# Patient Record
Sex: Female | Born: 1960 | Race: White | Hispanic: No | Smoking: Never smoker
Health system: Southern US, Community
[De-identification: ages and names within clinical notes are randomized; demographics above are authoritative.]

## PROBLEM LIST (undated history)

## (undated) DIAGNOSIS — Z1239 Encounter for other screening for malignant neoplasm of breast: Secondary | ICD-10-CM

## (undated) DIAGNOSIS — F909 Attention-deficit hyperactivity disorder, unspecified type: Secondary | ICD-10-CM

## (undated) DIAGNOSIS — F419 Anxiety disorder, unspecified: Secondary | ICD-10-CM

## (undated) DIAGNOSIS — Z8541 Personal history of malignant neoplasm of cervix uteri: Secondary | ICD-10-CM

## (undated) DIAGNOSIS — I7121 Aneurysm of the ascending aorta, without rupture: Secondary | ICD-10-CM

## (undated) DIAGNOSIS — I10 Essential (primary) hypertension: Secondary | ICD-10-CM

## (undated) DIAGNOSIS — G4730 Sleep apnea, unspecified: Secondary | ICD-10-CM

## (undated) DIAGNOSIS — R931 Abnormal findings on diagnostic imaging of heart and coronary circulation: Secondary | ICD-10-CM

## (undated) DIAGNOSIS — C801 Malignant (primary) neoplasm, unspecified: Secondary | ICD-10-CM

## (undated) HISTORY — PX: EYE SURGERY: SHX253

## (undated) HISTORY — DX: Abnormal findings on diagnostic imaging of heart and coronary circulation: R93.1

## (undated) HISTORY — DX: Personal history of malignant neoplasm of cervix uteri: Z85.41

## (undated) HISTORY — PX: REPLACEMENT TOTAL KNEE: SUR1224

## (undated) HISTORY — DX: Essential (primary) hypertension: I10

## (undated) HISTORY — PX: UPPER GI ENDOSCOPY: SHX6162

## (undated) HISTORY — PX: TUBAL LIGATION: SHX77

## (undated) HISTORY — PX: COLONOSCOPY: SHX174

## (undated) HISTORY — DX: Aneurysm of the ascending aorta, without rupture: I71.21

## (undated) HISTORY — DX: Encounter for other screening for malignant neoplasm of breast: Z12.39

## (undated) HISTORY — PX: HERNIA REPAIR: SHX51

---

## 1999-01-15 ENCOUNTER — Other Ambulatory Visit: Admission: RE | Admit: 1999-01-15 | Discharge: 1999-01-15 | Payer: Self-pay | Admitting: Obstetrics and Gynecology

## 1999-11-13 ENCOUNTER — Encounter: Admission: RE | Admit: 1999-11-13 | Discharge: 1999-11-13 | Payer: Self-pay | Admitting: Internal Medicine

## 1999-11-13 ENCOUNTER — Encounter: Payer: Self-pay | Admitting: Internal Medicine

## 1999-11-22 ENCOUNTER — Encounter: Admission: RE | Admit: 1999-11-22 | Discharge: 1999-11-22 | Payer: Self-pay | Admitting: Internal Medicine

## 1999-11-22 ENCOUNTER — Encounter: Payer: Self-pay | Admitting: Internal Medicine

## 2000-11-25 ENCOUNTER — Encounter: Payer: Self-pay | Admitting: Obstetrics and Gynecology

## 2000-11-25 ENCOUNTER — Encounter: Admission: RE | Admit: 2000-11-25 | Discharge: 2000-11-25 | Payer: Self-pay | Admitting: Obstetrics and Gynecology

## 2000-12-24 ENCOUNTER — Other Ambulatory Visit: Admission: RE | Admit: 2000-12-24 | Discharge: 2000-12-24 | Payer: Self-pay | Admitting: Obstetrics and Gynecology

## 2003-12-21 ENCOUNTER — Ambulatory Visit (HOSPITAL_BASED_OUTPATIENT_CLINIC_OR_DEPARTMENT_OTHER): Admission: RE | Admit: 2003-12-21 | Discharge: 2003-12-21 | Payer: Self-pay | Admitting: Orthopaedic Surgery

## 2003-12-21 ENCOUNTER — Ambulatory Visit (HOSPITAL_COMMUNITY): Admission: RE | Admit: 2003-12-21 | Discharge: 2003-12-21 | Payer: Self-pay | Admitting: Orthopaedic Surgery

## 2007-12-26 ENCOUNTER — Emergency Department (HOSPITAL_COMMUNITY): Admission: EM | Admit: 2007-12-26 | Discharge: 2007-12-27 | Payer: Self-pay | Admitting: Emergency Medicine

## 2010-06-08 ENCOUNTER — Ambulatory Visit: Payer: Self-pay | Admitting: Sports Medicine

## 2010-06-08 DIAGNOSIS — M25559 Pain in unspecified hip: Secondary | ICD-10-CM | POA: Insufficient documentation

## 2010-06-08 DIAGNOSIS — IMO0002 Reserved for concepts with insufficient information to code with codable children: Secondary | ICD-10-CM

## 2010-06-08 DIAGNOSIS — M25569 Pain in unspecified knee: Secondary | ICD-10-CM

## 2010-06-08 DIAGNOSIS — G8929 Other chronic pain: Secondary | ICD-10-CM | POA: Insufficient documentation

## 2010-06-26 ENCOUNTER — Ambulatory Visit: Payer: Self-pay | Admitting: Obstetrics and Gynecology

## 2010-07-16 ENCOUNTER — Ambulatory Visit: Payer: Self-pay | Admitting: Sports Medicine

## 2010-07-18 ENCOUNTER — Ambulatory Visit: Payer: Self-pay | Admitting: Obstetrics and Gynecology

## 2010-08-10 ENCOUNTER — Encounter: Admission: RE | Admit: 2010-08-10 | Discharge: 2010-08-10 | Payer: Self-pay | Admitting: Obstetrics and Gynecology

## 2010-08-14 ENCOUNTER — Ambulatory Visit: Payer: Self-pay | Admitting: Oncology

## 2010-09-10 ENCOUNTER — Ambulatory Visit (HOSPITAL_COMMUNITY): Admission: RE | Admit: 2010-09-10 | Discharge: 2010-09-10 | Payer: Self-pay | Admitting: General Surgery

## 2010-09-27 ENCOUNTER — Ambulatory Visit (HOSPITAL_BASED_OUTPATIENT_CLINIC_OR_DEPARTMENT_OTHER): Payer: BC Managed Care – PPO | Admitting: Oncology

## 2010-10-01 LAB — FOLLICLE STIMULATING HORMONE: FSH: 49.8 m[IU]/mL

## 2010-10-17 ENCOUNTER — Ambulatory Visit: Payer: Self-pay | Admitting: Obstetrics and Gynecology

## 2010-11-20 NOTE — Letter (Signed)
Summary: Generic Letter  [facility]  [address]   [place], [place] [address]   Phone: [phone]  Fax: [phone]    06/08/2010 Albertha Ghee, MD [facility] [place], [place] Fax [place] [address]   Dear [name]:  PLease see attached note on the patient below:  Ashlee Robbins [address] [place], [place]  [address]  She did find her way over here and wanted a piriformis injection so I gave that a try.  Of interest she does have a line of calcification in the piriformis consistent with a healing tear.         Sincerely,  Vincent Gros MD

## 2010-11-20 NOTE — Assessment & Plan Note (Signed)
Summary: NP,U/S OF PIRIFORMIS PER BASSETT,[facility]   Vital Signs:  Patient profile:   50 year old female Height:      67 inches Weight:      150 pounds BMI:     23.58 BP sitting:   169 / 116  Vitals Entered By: Lillia Pauls CMA (June 08, 2010 10:34 AM)  History of Present Illness: May 13 reaching for high backhand felt tear in RT center buttocks cont playing later icing but no better  given lyrica and rest by DR bassett and this did help however, too sleepy on lyrica so she stopped not on any PT She wanted an injection rather than meds and so Dr Cleophas Dunker suggested she could come here  no real probs w low back or SIJ  no prior hx of hip injury  has had arthrocopy on RT knee sometime in past RT knee does click and sometimes has given way on tennis court note when she did spin classes the knee did much better no swelling  some early bunion issues without much pain  Allergies (verified): No Known Drug Allergies  Past History:  Past Medical History: HBP normally takes prinizide but has been off  Family History: Fam Hx of HBP mother and aunt with breast ca grandfather had throat ca  Physical Exam  General:  Well-developed,well-nourished,in no acute distress; alert,appropriate and cooperative throughout examination Msk:  norm rotational motion of both hips weak on  abduction and on hip rotation on left RT hip is ver strong on these flexion and ext norm  leg lenth equal  piriformis stretch recreates pain TTP deep directly over area of piriformis  RT knee + McMurray with lots of clikcing on laeral joint line also noted some prob joint line spurring more laterally no effusion Additional Exam:  MSK [place] On virtual hip setting we can identify linear streak of calcification this appears to be at the fascial plane of inferior piriformis this is noted on long and transverse views no inc doppler activity  no active tear noted   Of note the RT knee lateral meniscus is  calcified and torn on scan and there is also DJD spurring   Impression & Recommendations:  Problem # 1:  HIP PAIN, LEFT (ICD-719.45)  this seems likd classic piriformis and on [place] there is an area of calcification consistent w a healed tear   cleanse with alcohol Topical analgesic spray : Ethyl choride Joint Approached in typical fashion with: [place] localization of calcifcation which corresponds to PMT - this was directlly infiltrated and fanned Completed without difficulty Meds:1 cc kenalog 40 + 6 cc lidocaine Needle:25 G and 1.5 in Aftercare instructions and Red flags advised  given stretches and exercises would like to reck in 6 wks  Orders: [place] LIMITED ([address]) Joint Aspirate / Injection, Large (20610) Kenalog 10 mg inj (J3301)  Problem # 2:  KNEE PAIN, RIGHT, CHRONIC (ICD-719.46) I think she needsto restart bike exercises folow for swelling or mechanical sxs  Problem # 3:  MENISCUS TEAR, RIGHT (ICD-836.2) pretty obvious tear and DJD on RT  if this gives way or cause much pain - return and we will use a don joy

## 2010-11-20 NOTE — Assessment & Plan Note (Signed)
Summary: F/U,MC   Vital Signs:  Patient profile:   50 year old female Pulse rate:   76 / minute BP sitting:   148 / 95  (left arm)  Vitals Entered By: Lillia Pauls CMA (July 16, 2010 9:14 AM) CC: f/u pirformis and rt knee pain   CC:  f/u pirformis and rt knee pain.  History of Present Illness: 1. F/U Piriformis:  - Is about 75% better.  Thinks that the injection helped the most and would be interested in getting another one today if possible. - She has been doing the stretches as directed - She has been continuing to play tennis  2. Right knee pain: - Diagnosed with DJD and right meniscal tear by ultrasound - She has still been playing tennis but has noticed that it is more painful now - Pain is worse when she does a deep squat - It has not been catching or locking - No swelling  Allergies: No Known Drug Allergies  Physical Exam  General:  Well-developed,well-nourished,in no acute distress; alert,appropriate and cooperative throughout examination Msk:  norm rotational motion of both hips equal strength bilaterally  leg lenth equal  piriformis stretch recreates pain TTP deep directly over area of piriformis  RT knee + McMurray with lots of clikcing on lateral joint line Pain reproduced with deep squat Negative standing meniscal test   Impression & Recommendations:  Problem # 1:  HIP PAIN, RIGHT (ICD-719.45) Assessment Improved  Doing better.    Her updated medication list for this problem includes:    Tramadol Hcl 50 Mg Tabs (Tramadol hcl) .Marland Kitchen... 1 tab by mouth twice a day  would not inject again for at least 6 wks - she did get excellent relief  Problem # 2:  KNEE PAIN, RIGHT, CHRONIC (ICD-719.46) Assessment: Deteriorated  Worse since last visit.  Her updated medication list for this problem includes:    Tramadol Hcl 50 Mg Tabs (Tramadol hcl) .Marland Kitchen... 1 tab by mouth twice a day  keep using knee strap as this looks helpful  With DJD may need some  chronic med to control and will try tramadol  Problem # 3:  MENISCUS TEAR, RIGHT (ICD-836.2) this seems degenerative will follow but use some knee support avoid deep knee bend  Complete Medication List: 1)  Tramadol Hcl 50 Mg Tabs (Tramadol hcl) .Marland Kitchen.. 1 tab by mouth twice a day Prescriptions: TRAMADOL HCL 50 MG TABS (TRAMADOL HCL) 1 tab by mouth twice a day  #100 x 1   Entered by:   Angelena Sole MD   Authorized by:   Enid Baas MD   Signed by:   Angelena Sole MD on 07/16/2010   Method used:   Electronically to        [facility] [id number] (retail)       [address] [place], [place]  [address]       Ph: [phone]       Fax: [phone]   RxID:   [id number]

## 2010-11-26 ENCOUNTER — Encounter: Payer: BC Managed Care – PPO | Admitting: Internal Medicine

## 2010-11-26 DIAGNOSIS — Z803 Family history of malignant neoplasm of breast: Secondary | ICD-10-CM

## 2010-11-26 LAB — CBC WITH DIFFERENTIAL/PLATELET
BASO%: 0.5 % (ref 0.0–2.0)
Basophils Absolute: 0 10*3/uL (ref 0.0–0.1)
EOS%: 0.3 % (ref 0.0–7.0)
Eosinophils Absolute: 0 10*3/uL (ref 0.0–0.5)
HCT: 41 % (ref 34.8–46.6)
HGB: 14.3 g/dL (ref 11.6–15.9)
LYMPH%: 26 % (ref 14.0–49.7)
MCH: 34.5 pg — ABNORMAL HIGH (ref 25.1–34.0)
MCHC: 34.8 g/dL (ref 31.5–36.0)
MCV: 99.3 fL (ref 79.5–101.0)
MONO#: 0.4 10*3/uL (ref 0.1–0.9)
MONO%: 6.8 % (ref 0.0–14.0)
NEUT#: 3.9 10*3/uL (ref 1.5–6.5)
NEUT%: 66.4 % (ref 38.4–76.8)
Platelets: 267 10*3/uL (ref 145–400)
RBC: 4.13 10*6/uL (ref 3.70–5.45)
RDW: 13.6 % (ref 11.2–14.5)
WBC: 5.9 10*3/uL (ref 3.9–10.3)
lymph#: 1.5 10*3/uL (ref 0.9–3.3)

## 2010-11-26 LAB — GAMMA GT: GGT: 18 U/L (ref 7–51)

## 2010-11-27 ENCOUNTER — Other Ambulatory Visit: Payer: Self-pay | Admitting: Oncology

## 2010-11-27 DIAGNOSIS — Z1239 Encounter for other screening for malignant neoplasm of breast: Secondary | ICD-10-CM

## 2010-11-27 DIAGNOSIS — Z803 Family history of malignant neoplasm of breast: Secondary | ICD-10-CM

## 2010-11-27 LAB — COMPREHENSIVE METABOLIC PANEL
ALT: 18 U/L (ref 0–35)
AST: 26 U/L (ref 0–37)
Albumin: 4.7 g/dL (ref 3.5–5.2)
Alkaline Phosphatase: 44 U/L (ref 39–117)
BUN: 16 mg/dL (ref 6–23)
CO2: 26 mEq/L (ref 19–32)
Calcium: 10.1 mg/dL (ref 8.4–10.5)
Chloride: 101 mEq/L (ref 96–112)
Creatinine, Ser: 0.91 mg/dL (ref 0.40–1.20)
Glucose, Bld: 85 mg/dL (ref 70–99)
Potassium: 4.2 mEq/L (ref 3.5–5.3)
Sodium: 140 mEq/L (ref 135–145)
Total Bilirubin: 0.6 mg/dL (ref 0.3–1.2)
Total Protein: 7.2 g/dL (ref 6.0–8.3)

## 2010-11-27 LAB — VITAMIN D 25 HYDROXY (VIT D DEFICIENCY, FRACTURES): Vit D, 25-Hydroxy: 72 ng/mL (ref 30–89)

## 2010-12-12 ENCOUNTER — Other Ambulatory Visit: Payer: BC Managed Care – PPO

## 2010-12-12 ENCOUNTER — Other Ambulatory Visit: Payer: Self-pay | Admitting: Oncology

## 2010-12-12 DIAGNOSIS — Z1239 Encounter for other screening for malignant neoplasm of breast: Secondary | ICD-10-CM

## 2011-01-01 LAB — COMPREHENSIVE METABOLIC PANEL
ALT: 19 U/L (ref 0–35)
AST: 27 U/L (ref 0–37)
Alkaline Phosphatase: 41 U/L (ref 39–117)
CO2: 29 mEq/L (ref 19–32)
Calcium: 9.4 mg/dL (ref 8.4–10.5)
Chloride: 105 mEq/L (ref 96–112)
GFR calc Af Amer: >60 mL/min (ref >60)
GFR calc non Af Amer: >60 mL/min (ref >60)
Potassium: 4.3 mEq/L (ref 3.5–5.1)
Sodium: 142 mEq/L (ref 135–145)
Total Bilirubin: 1 mg/dL (ref 0.3–1.2)

## 2011-01-01 LAB — CBC
HCT: 41.7 % (ref 36.0–46.0)
Hemoglobin: 14.5 g/dL (ref 12.0–15.0)
RBC: 4.16 MIL/uL (ref 3.87–5.11)
WBC: 4.2 10*3/uL (ref 4.0–10.5)

## 2011-01-01 LAB — DIFFERENTIAL
Basophils Relative: 1 % (ref 0–1)
Eosinophils Absolute: 0.1 10*3/uL (ref 0.0–0.7)
Eosinophils Relative: 1 % (ref 0–5)
Lymphs Abs: 1.4 10*3/uL (ref 0.7–4.0)

## 2011-01-01 LAB — SURGICAL PCR SCREEN
MRSA, PCR: NEGATIVE
Staphylococcus aureus: NEGATIVE

## 2011-03-08 NOTE — Op Note (Signed)
NAME:  RHANDI, DESPAIN                           ACCOUNT NO.:  [id number]   MEDICAL RECORD NO.:  [id number]                   PATIENT TYPE:  AMB   LOCATION:  [facility]                                  FACILITY:  [facility]   PHYSICIAN:  Claude Manges. Cleophas Dunker, M.D.            DATE OF BIRTH:  1961/06/16   DATE OF PROCEDURE:  12/21/2003  DATE OF DISCHARGE:                                 OPERATIVE REPORT   PREOPERATIVE DIAGNOSES:  1. Bucket handle tear of lateral meniscus with displacement.  2. Degenerative changes, lateral compartment and patellofemoral joint, right     knee.   POSTOPERATIVE DIAGNOSES:  1. Bucket handle tear of lateral meniscus with displacement.  2. Degenerative changes, lateral compartment and patellofemoral joint, right     knee.   PROCEDURES:  1. Diagnostic arthroscopy, right knee.  2. Partial lateral meniscectomy.  3. Shaving of patellofemoral articular cartilage.   SURGEON:  Claude Manges. Cleophas Dunker, M.D.   ANESTHESIA:  General orotracheal.   COMPLICATIONS:  None.   HISTORY:  This 50 year old female apparently had an injury to her right knee  about 20 years ago requiring a repair of the lateral meniscus.  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  She has actually done very well up  until the last two or three months when she has been experiencing recurrent  pain in her right knee.  She is very active in tennis, but is not aware that  she has had a specific injury or trauma.  She does have a slight anterior  drawer sign compared to the opposite knee but really has not had any  sensation of her knee giving way.  Her pain is localized on the lateral  joint. She has had an MRI scan consistent with a bucket handle tear of the  lateral meniscus with displacement.  She also was noted to have a tear of  the anterior cruciate ligament.  At this point, we will perform arthroscopy  of the knee and consider lateral meniscectomy without addressing the ACL  tear.   PROCEDURE:   With the patient comfortable on the operating room table and  under orolaryngeal general anesthetic, the right lower extremity was placed  in a thigh holder.  The leg was then prepped with Duraprep from the thigh  holder to the ankle.  Sterile draping was performed.   Then 0.25% Marcaine with epinephrine was injected into the arthroscopic  portals on either side of the patella tendon at the level of the joint.  Small puncture wounds were made.  The arthroscope was then placed into the  joint without evidence of effusion. Diagnostic arthroscopy revealed no  evidence of loose material.  In the superior pouch, there was very minimal  synovitis.  There was some very mild chondromalacia of the patella and to a  greater extent, there were grade 2 and even early grade 3 changes of  chondromalacia of the trochlea.  This area was shaved.  Both gutters were  clear of any loose material or significant synovitis.   The medial compartment was clear of meniscal pathology or chondromalacia.   The intercondylar notch was then evaluated.  There was an obvious displaced  bucket handle tear of the lateral meniscus that obliterated the lateral  joint space and much of the intercondylar notch.  The interarticular shaver  was then introduced so that I could amputate the torn lateral meniscus in  its posterior extent.  Further shaving was then performed to remove any  further loose and torn lateral meniscal tissue.  The lateral compartment was  then more easily evaluated.  There was some chondromalacia involving grade 2  changes of the femoral condyle and tibial plateau.  The ACL was chronically  torn.  There was about a 2-mm anterior drawer sign.  There were still some  fibers of the anterior cruciate ligament remaining.  I carefully probed the  remaining lateral meniscus.  It was torn for the most part from its mid-  portion posteriorly, and a very few areas of frayed meniscus just anterior  to the midline.  Further shaving was then performed, and it was stabilized  with the ArthroCare wand.  The joint was carefully inspected for evidence of  loose material.  The irrigation fluid was removed.  The two stab wounds were  again infiltrated with 0.25% Marcaine with epinephrine.  A sterile bulky  dressing was applied followed by an Ace bandage.   PLAN:  Percocet for pain, office one week.                                               Claude Manges. Cleophas Dunker, M.D.    PWW/MEDQ  D:  12/21/2003  T:  12/21/2003  Job:  [id number]

## 2011-03-29 ENCOUNTER — Ambulatory Visit (INDEPENDENT_AMBULATORY_CARE_PROVIDER_SITE_OTHER): Payer: BC Managed Care – PPO | Admitting: Family Medicine

## 2011-03-29 ENCOUNTER — Encounter: Payer: Self-pay | Admitting: Family Medicine

## 2011-03-29 VITALS — BP 144/98

## 2011-03-29 DIAGNOSIS — M25559 Pain in unspecified hip: Secondary | ICD-10-CM

## 2011-03-29 NOTE — Assessment & Plan Note (Signed)
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 She has no signs of acute injury. - Right piriformis injection was completed in the office today Consent obtained and verified. Sterile betadine prep. Furthur cleansed with alcohol. Topical analgesic spray: Ethyl chloride. Joint: Right piriformis Approached in typical fashion with: posterior approach.  Area of maximal tenderness was identified with MSK ultrasound & area was marked.  Injection completed without ultrasound guidance Completed without difficulty Meds: 1 cc Kenalog 40 mg/cc +6 cc 1% lidocaine Needle: 25-gauge spinal needle Aftercare instructions and Red flags advised. Tolerated procedure well without any complications - Reviewed hip exercises to improve strength. Showed several stretches to continue to help with her flexibility. - Recommend she rest from tennis for the next one to 2 days, then may return. - Followup 6 weeks for reevaluation, call if any questions or concerns

## 2011-03-29 NOTE — Progress Notes (Signed)
  Subjective:    Patient ID: Ashlee Robbins, female    DOB: [birth year], 50 y.o.   MRN: [id number]  HPI 50 year old female to the office for evaluation of right posterior hip pain. Seen in our office 05/2000 and diagnosed with piriformis syndrome with visible calcification in her puriform is consistent with a healed tear. She underwent piriformis is injection at that time with good relief of symptoms. On followup in September 2001 and was still having some pain was approximately 75% improved. Since that time she has had ongoing pain in her right buttock that is a dull ache at baseline but increases with activity. Does have some radiation of the pain down her posterior thigh stopping at her knee. It is worse after playing tennis, typically plays 2-3 times a week, also does spinning once a week. Ice does seem to help. She is working with a Systems analyst, and no longer doing hip exercises we have shown her, but she is doing several hip and piriformis stretches.   Review of Systems Per history of present illness otherwise negative    Objective:   Physical Exam GENERAL: Alert and oriented x3, no acute stress, pleasant SKIN: No rashes or lesions  MSK: - Hips: Right hip with good range of motion, does have slight restriction with internal and external rotation. She is weak with adduction and mildly weak with abduction. Piriformis stretch does recur produce pain. She is tender to palpation directly over piriformis and issue tuberosity, mild tenderness over the greater trochanter. Left hip with good range of motion with mild tightness with internal rotation. Also has mild weakness with hip abductors and adductors. No pain with piriformis stretch. - Back: Good range of motion without pain. No scoliosis. Excellent mobility of her SI joints, fever slightly tight on the right compared to the left. Negative straight leg raise bilaterally. - Gait: Walks with left foot slightly internally rotated. She has no leg  length difference. With running as forward leaning of her upper body, again noted to have internally rotated left foot. NEURO: Sensation intact to light touch, DTR +2/4 Achilles and patella bilaterally Vascular: Pulses 2+ and symmetric  MSK ultrasound: Right piriformis was visualized again noted to have linear streaky opacification consistent with healed previous tear. This was seen on both long and transverse views. She has no hypoechoic change and no increased Doppler flow. Images saved       Assessment & Plan:

## 2011-07-15 LAB — PROTIME-INR
INR: 0.9
Prothrombin Time: 12.5

## 2011-07-15 LAB — CBC
Platelets: 266
RBC: 3.63 — ABNORMAL LOW
WBC: 5.7

## 2011-08-12 ENCOUNTER — Ambulatory Visit
Admission: RE | Admit: 2011-08-12 | Discharge: 2011-08-12 | Disposition: A | Discharge status: Home / Self Care | Payer: BC Managed Care – PPO | Source: Ambulatory Visit | Attending: Oncology | Admitting: Oncology

## 2011-08-12 DIAGNOSIS — Z1239 Encounter for other screening for malignant neoplasm of breast: Secondary | ICD-10-CM

## 2011-08-14 ENCOUNTER — Other Ambulatory Visit: Payer: Self-pay | Admitting: Orthopedic Surgery

## 2011-08-14 DIAGNOSIS — S63639A Sprain of interphalangeal joint of unspecified finger, initial encounter: Secondary | ICD-10-CM

## 2011-08-19 ENCOUNTER — Other Ambulatory Visit: Payer: Self-pay | Admitting: *Deleted

## 2011-08-19 ENCOUNTER — Encounter: Payer: Self-pay | Admitting: *Deleted

## 2011-08-19 DIAGNOSIS — IMO0002 Reserved for concepts with insufficient information to code with codable children: Secondary | ICD-10-CM

## 2011-08-19 NOTE — Progress Notes (Signed)
  Subjective:    Patient ID: Ashlee Robbins, female    DOB: 08/27/1961, 50 y.o.   MRN: [id number]  HPI    Review of Systems     Objective:   Physical Exam        Assessment & Plan:  Pt is still having problems with locking and catching of the right knee, which we diagnosed a meniscal tear earlier this year. Pt has 2 scheduled MRIs for other issues on November 19th and is requesting on for her knee that day as well since she has not met her deductible. Scheduled pt for R knee MRI on November 19th at 3:15 at [facility] imaging. Pt informed.

## 2011-08-19 NOTE — Patient Instructions (Signed)
Prior auth for MRI of R knee is [id number]

## 2011-09-09 ENCOUNTER — Ambulatory Visit
Admission: RE | Admit: 2011-09-09 | Discharge: 2011-09-09 | Disposition: A | Discharge status: Home / Self Care | Payer: BC Managed Care – PPO | Source: Ambulatory Visit | Attending: Oncology | Admitting: Oncology

## 2011-09-09 ENCOUNTER — Ambulatory Visit
Admission: RE | Admit: 2011-09-09 | Discharge: 2011-09-09 | Disposition: A | Discharge status: Home / Self Care | Payer: BC Managed Care – PPO | Source: Ambulatory Visit | Attending: Sports Medicine | Admitting: Sports Medicine

## 2011-09-09 ENCOUNTER — Ambulatory Visit
Admission: RE | Admit: 2011-09-09 | Discharge: 2011-09-09 | Disposition: A | Discharge status: Home / Self Care | Payer: BC Managed Care – PPO | Source: Ambulatory Visit | Attending: Orthopedic Surgery | Admitting: Orthopedic Surgery

## 2011-09-09 DIAGNOSIS — Z1239 Encounter for other screening for malignant neoplasm of breast: Secondary | ICD-10-CM

## 2011-09-09 DIAGNOSIS — Z803 Family history of malignant neoplasm of breast: Secondary | ICD-10-CM

## 2011-09-09 DIAGNOSIS — IMO0002 Reserved for concepts with insufficient information to code with codable children: Secondary | ICD-10-CM

## 2011-09-09 DIAGNOSIS — S63639A Sprain of interphalangeal joint of unspecified finger, initial encounter: Secondary | ICD-10-CM

## 2011-09-09 MED ORDER — GADOBENATE DIMEGLUMINE 529 MG/ML IV SOLN
14.0000 mL | Freq: Once | INTRAVENOUS | Status: AC | PRN
  Administered 2011-09-09: 14 mL via INTRAVENOUS

## 2011-09-20 ENCOUNTER — Telehealth: Payer: Self-pay | Admitting: *Deleted

## 2011-09-20 NOTE — Telephone Encounter (Signed)
Pt.notified

## 2011-09-20 NOTE — Telephone Encounter (Signed)
Message copied by Cooper Render on Fri Sep 20, 2011 12:59 PM ------      Message from: Victorino December      Created: Wed Sep 11, 2011 12:00 PM       Call pt MRI looks good

## 2011-10-29 ENCOUNTER — Ambulatory Visit: Payer: BC Managed Care – PPO | Admitting: Sports Medicine

## 2011-11-09 ENCOUNTER — Telehealth: Payer: Self-pay | Admitting: Oncology

## 2011-11-09 NOTE — Telephone Encounter (Signed)
Converted 2/7 appt 2/18. S/w pt today she is aware.

## 2011-12-09 ENCOUNTER — Ambulatory Visit (HOSPITAL_BASED_OUTPATIENT_CLINIC_OR_DEPARTMENT_OTHER): Payer: BC Managed Care – PPO | Admitting: Oncology

## 2011-12-09 ENCOUNTER — Telehealth: Payer: Self-pay | Admitting: Oncology

## 2011-12-09 VITALS — BP 135/90 | HR 89 | Temp 97.7°F | Ht 67.0 in | Wt 153.2 lb

## 2011-12-09 DIAGNOSIS — Z1239 Encounter for other screening for malignant neoplasm of breast: Secondary | ICD-10-CM

## 2011-12-09 DIAGNOSIS — Z803 Family history of malignant neoplasm of breast: Secondary | ICD-10-CM

## 2011-12-09 NOTE — Telephone Encounter (Signed)
gve the pt her feb 2014 appt calendar °

## 2011-12-09 NOTE — Progress Notes (Signed)
OFFICE PROGRESS NOTE  CC  Ashlee Settler, MD, MD No address on file Dr. Avel Peace Dr. Edyth Gunnels  DIAGNOSIS: 51 year old female seen in the high-risk clinic for ongoing discussions about breast cancer risk reduction.  PRIOR THERAPY:  #1 she was originally seen by me in 2011. We did risk assessment and her cancer risk was estimated at 20-25%.  #2 patient has had BRCA1 and 2 testing performed which was negative. She also had MEN2 gene RET done as well that was negative.  #3 patient was recommended Evista on a daily basis but she could not tolerate it and she discontinued it.  #3 she does get yearly MRIs as well as mammograms as part of her ongoing surveillance. She will be due for a mammogram  Sometime this year. By recommendation would also be for her to get yearly MRIs performed as well.  CURRENT THERAPY:Observation and radiographic surveillance with mammograms and MRIs on a yearly basis.  INTERVAL HISTORY: Ashlee Robbins 51 y.o. female returns for Followup visit today. Overall she is doing well. She denies any fevers chills night sweats headaches shortness of breath chest pains palpitations she has no myalgias or arthralgias. She does not have any evidence of any breast masses. She continues to be followed by her primary care physician as well as her gynecologist. She is very active she continues to play tennis avidly. Remainder of the 10 point review of systems is negative.  MEDICAL HISTORY:No past medical history on file.  ALLERGIES:   has no known allergies.  MEDICATIONS:  No current outpatient prescriptions on file.    SURGICAL HISTORY: No past surgical history on file.  REVIEW OF SYSTEMS:  Pertinent items are noted in HPI.   PHYSICAL EXAMINATION: General appearance: alert, cooperative and appears stated age Neck: no adenopathy, no carotid bruit, no JVD, supple, symmetrical, trachea midline and thyroid not enlarged, symmetric, no tenderness/mass/nodules Lymph  nodes: Cervical, supraclavicular, and axillary nodes normal. Resp: clear to auscultation bilaterally and normal percussion bilaterally Back: symmetric, no curvature. ROM normal. No CVA tenderness. Cardio: regular rate and rhythm, S1, S2 normal, no murmur, click, rub or gallop GI: soft, non-tender; bowel sounds normal; no masses,  no organomegaly Extremities: extremities normal, atraumatic, no cyanosis or edema Neurologic: Alert and oriented X 3, normal strength and tone. Normal symmetric reflexes. Normal coordination and gait Bilateral breast examinations: There are no palpable masses nipple discharge or skin changes bilaterally. ECOG PERFORMANCE STATUS: 0 - Asymptomatic  Blood pressure 135/90, pulse 89, temperature 97.7 F (36.5 C), temperature source Oral, height 5\' 7"  (1.702 m), weight 153 lb 3.2 oz (69.491 kg).  LABORATORY DATA: Lab Results  Component Value Date   WBC 5.9 11/26/2010   HGB 14.3 11/26/2010   HCT 41.0 11/26/2010   MCV 99.3 11/26/2010   PLT 267 11/26/2010      Chemistry      Component Value Date/Time   NA 140 11/26/2010 1339   K 4.2 11/26/2010 1339   CL 101 11/26/2010 1339   CO2 26 11/26/2010 1339   BUN 16 11/26/2010 1339   CREATININE 0.91 11/26/2010 1339      Component Value Date/Time   CALCIUM 10.1 11/26/2010 1339   ALKPHOS 44 11/26/2010 1339   AST 26 11/26/2010 1339   ALT 18 11/26/2010 1339   BILITOT 0.6 11/26/2010 1339       RADIOGRAPHIC STUDIES:  No results found.  ASSESSMENT: 51 year old female at high-risk for developing breast cancer due to her family history and 5 this evaluation breast  cancer risk estimated at 20-25%. Patient has had BRCA1 and 2 testing performed which was negative for both of the mutations. Overall she is doing well. Vision has been tried on Evista but she could not tolerate it and now she is on observation only.   PLAN:   #1 continue yearly MRI and mammogram due to her high risk of developing breast cancer.  #2 she will be seen back in one years  time.   All questions were answered. The patient knows to call the clinic with any problems, questions or concerns. We can certainly see the patient much sooner if necessary.  I spent 30 minutes counseling the patient face to face. The total time spent in the appointment was 30 minutes.    Drue Second, MD Medical/Oncology [facility] [phone] (beeper) [phone] (Office)  12/09/2011, 3:20 PM

## 2012-07-28 ENCOUNTER — Other Ambulatory Visit: Payer: Self-pay | Admitting: Oncology

## 2012-07-28 DIAGNOSIS — Z1231 Encounter for screening mammogram for malignant neoplasm of breast: Secondary | ICD-10-CM

## 2012-08-19 ENCOUNTER — Ambulatory Visit: Payer: BC Managed Care – PPO

## 2012-09-30 ENCOUNTER — Ambulatory Visit: Payer: BC Managed Care – PPO

## 2012-10-01 ENCOUNTER — Ambulatory Visit
Admission: RE | Admit: 2012-10-01 | Discharge: 2012-10-01 | Disposition: A | Discharge status: Home / Self Care | Payer: BC Managed Care – PPO | Source: Ambulatory Visit | Attending: Oncology | Admitting: Oncology

## 2012-10-01 DIAGNOSIS — Z1231 Encounter for screening mammogram for malignant neoplasm of breast: Secondary | ICD-10-CM

## 2012-12-08 ENCOUNTER — Other Ambulatory Visit: Payer: Self-pay | Admitting: Medical Oncology

## 2012-12-08 DIAGNOSIS — M25569 Pain in unspecified knee: Secondary | ICD-10-CM

## 2012-12-08 DIAGNOSIS — IMO0002 Reserved for concepts with insufficient information to code with codable children: Secondary | ICD-10-CM

## 2012-12-08 DIAGNOSIS — M25559 Pain in unspecified hip: Secondary | ICD-10-CM

## 2012-12-09 ENCOUNTER — Encounter: Payer: Self-pay | Admitting: Oncology

## 2012-12-09 ENCOUNTER — Telehealth: Payer: Self-pay | Admitting: Oncology

## 2012-12-09 ENCOUNTER — Other Ambulatory Visit (HOSPITAL_BASED_OUTPATIENT_CLINIC_OR_DEPARTMENT_OTHER): Payer: BC Managed Care – PPO | Admitting: Lab

## 2012-12-09 ENCOUNTER — Ambulatory Visit (HOSPITAL_BASED_OUTPATIENT_CLINIC_OR_DEPARTMENT_OTHER): Payer: BC Managed Care – PPO | Admitting: Oncology

## 2012-12-09 VITALS — BP 143/99 | HR 70 | Temp 97.9°F | Resp 20 | Ht 67.0 in | Wt 155.0 lb

## 2012-12-09 DIAGNOSIS — Z1239 Encounter for other screening for malignant neoplasm of breast: Secondary | ICD-10-CM

## 2012-12-09 DIAGNOSIS — M25559 Pain in unspecified hip: Secondary | ICD-10-CM

## 2012-12-09 DIAGNOSIS — M25569 Pain in unspecified knee: Secondary | ICD-10-CM

## 2012-12-09 DIAGNOSIS — Z803 Family history of malignant neoplasm of breast: Secondary | ICD-10-CM

## 2012-12-09 DIAGNOSIS — IMO0002 Reserved for concepts with insufficient information to code with codable children: Secondary | ICD-10-CM

## 2012-12-09 HISTORY — DX: Encounter for other screening for malignant neoplasm of breast: Z12.39

## 2012-12-09 LAB — COMPREHENSIVE METABOLIC PANEL (CC13)
AST: 21 U/L (ref 5–34)
Albumin: 4 g/dL (ref 3.5–5.0)
Alkaline Phosphatase: 54 U/L (ref 40–150)
Calcium: 9.3 mg/dL (ref 8.4–10.4)
Chloride: 107 mEq/L (ref 98–107)
Glucose: 74 mg/dl (ref 70–99)
Potassium: 4 mEq/L (ref 3.5–5.1)
Sodium: 144 mEq/L (ref 136–145)
Total Protein: 7.2 g/dL (ref 6.4–8.3)

## 2012-12-09 LAB — CBC WITH DIFFERENTIAL/PLATELET
Eosinophils Absolute: 0 10*3/uL (ref 0.0–0.5)
MCV: 100 fL (ref 79.5–101.0)
MONO%: 6.3 % (ref 0.0–14.0)
NEUT#: 3.5 10*3/uL (ref 1.5–6.5)
RBC: 4.19 10*6/uL (ref 3.70–5.45)
RDW: 13.5 % (ref 11.2–14.5)
WBC: 5.6 10*3/uL (ref 3.9–10.3)
lymph#: 1.7 10*3/uL (ref 0.9–3.3)

## 2012-12-09 NOTE — Patient Instructions (Addendum)
Proceed with MRI of breasts  I will see you back in 1 year

## 2012-12-09 NOTE — Progress Notes (Signed)
OFFICE PROGRESS NOTE  CC  [name],[name], MD No address on file Dr. Avel Peace Dr. Edyth Gunnels  DIAGNOSIS: 52 year old female seen in the high-risk clinic for ongoing discussions about breast cancer risk reduction.  PRIOR THERAPY:  #1 she was originally seen by me in 2011. We did risk assessment and her cancer risk was estimated at 20-25%.  #2 patient has had BRCA1 and 2 testing performed which was negative. She also had MEN2 gene RET done as well that was negative.  #3 patient was recommended Evista on a daily basis but she could not tolerate it and she discontinued it.  #3 she does get yearly MRIs as well as mammograms as part of her ongoing surveillance. She will be due for a mammogram  Sometime this year. By recommendation would also be for her to get yearly MRIs performed as well.  CURRENT THERAPY:Observation and radiographic surveillance with mammograms and MRIs on a yearly basis.  INTERVAL HISTORY: Ashlee Robbins 52 y.o. female returns for Followup visit today. Overall she is doing well. She denies any fevers chills night sweats headaches shortness of breath chest pains palpitations she has no myalgias or arthralgias. She does not have any evidence of any breast masses. She continues to be followed by her primary care physician as well as her gynecologist. She is very active she continues to play tennis avidly. Remainder of the 10 point review of systems is negative.  MEDICAL HISTORY: Past Medical History  Diagnosis Date  . Breast cancer screening, high risk patient 12/09/2012    ALLERGIES:  has No Known Allergies.  MEDICATIONS:  No current outpatient prescriptions on file.   No current facility-administered medications for this visit.    SURGICAL HISTORY: History reviewed. No pertinent past surgical history.  REVIEW OF SYSTEMS:  Pertinent items are noted in HPI.   PHYSICAL EXAMINATION: General appearance: alert, cooperative and appears stated age Neck:  no adenopathy, no carotid bruit, no JVD, supple, symmetrical, trachea midline and thyroid not enlarged, symmetric, no tenderness/mass/nodules Lymph nodes: Cervical, supraclavicular, and axillary nodes normal. Resp: clear to auscultation bilaterally and normal percussion bilaterally Back: symmetric, no curvature. ROM normal. No CVA tenderness. Cardio: regular rate and rhythm, S1, S2 normal, no murmur, click, rub or gallop GI: soft, non-tender; bowel sounds normal; no masses,  no organomegaly Extremities: extremities normal, atraumatic, no cyanosis or edema Neurologic: Alert and oriented X 3, normal strength and tone. Normal symmetric reflexes. Normal coordination and gait Bilateral breast examinations: There are no palpable masses nipple discharge or skin changes bilaterally. ECOG PERFORMANCE STATUS: 0 - Asymptomatic  There were no vitals taken for this visit.  LABORATORY DATA: Lab Results  Component Value Date   WBC 5.6 12/09/2012   HGB 14.3 12/09/2012   HCT 41.9 12/09/2012   MCV 100.0 12/09/2012   PLT 284 12/09/2012      Chemistry      Component Value Date/Time   NA 144 12/09/2012 1230   NA 140 11/26/2010 1339   K 4.0 12/09/2012 1230   K 4.2 11/26/2010 1339   CL 107 12/09/2012 1230   CL 101 11/26/2010 1339   CO2 28 12/09/2012 1230   CO2 26 11/26/2010 1339   BUN 21.9 12/09/2012 1230   BUN 16 11/26/2010 1339   CREATININE 1.0 12/09/2012 1230   CREATININE 0.91 11/26/2010 1339      Component Value Date/Time   CALCIUM 9.3 12/09/2012 1230   CALCIUM 10.1 11/26/2010 1339   ALKPHOS 54 12/09/2012 1230   ALKPHOS 44 11/26/2010  1339   AST 21 12/09/2012 1230   AST 26 11/26/2010 1339   ALT 19 12/09/2012 1230   ALT 18 11/26/2010 1339   BILITOT 0.59 12/09/2012 1230   BILITOT 0.6 11/26/2010 1339       RADIOGRAPHIC STUDIES:  No results found.  ASSESSMENT: 52 year old female at high-risk for developing breast cancer due to her family history and 5 this evaluation breast cancer risk estimated at 20-25%. Patient has  had BRCA1 and 2 testing performed which was negative for both of the mutations. Overall she is doing well. Vision has been tried on Evista but she could not tolerate it and now she is on observation only.   PLAN:   #1 continue yearly MRI and mammogram due to her high risk of developing breast cancer.  #2 she will be seen back in one years time.   All questions were answered. The patient knows to call the clinic with any problems, questions or concerns. We can certainly see the patient much sooner if necessary.  I spent 25 minutes counseling the patient face to face. The total time spent in the appointment was 30 minutes.    Drue Second, MD Medical/Oncology [facility] [phone] (beeper) [phone] (Office)  12/09/2012, 1:26 PM

## 2012-12-09 NOTE — Telephone Encounter (Signed)
gve the pt her feb 2015 appt calendar. Lmonvm of kathy mcconnell regarding the pt needing a breast mri done at [facility]

## 2013-06-22 ENCOUNTER — Ambulatory Visit: Payer: BC Managed Care – PPO | Admitting: Sports Medicine

## 2013-08-05 ENCOUNTER — Encounter: Payer: Self-pay | Admitting: Sports Medicine

## 2013-08-05 ENCOUNTER — Ambulatory Visit (INDEPENDENT_AMBULATORY_CARE_PROVIDER_SITE_OTHER): Payer: BC Managed Care – PPO | Admitting: Sports Medicine

## 2013-08-05 VITALS — BP 136/94 | Ht 66.0 in | Wt 150.0 lb

## 2013-08-05 DIAGNOSIS — G5700 Lesion of sciatic nerve, unspecified lower limb: Secondary | ICD-10-CM

## 2013-08-05 DIAGNOSIS — M25331 Other instability, right wrist: Secondary | ICD-10-CM

## 2013-08-05 DIAGNOSIS — M25569 Pain in unspecified knee: Secondary | ICD-10-CM

## 2013-08-05 DIAGNOSIS — M25561 Pain in right knee: Secondary | ICD-10-CM

## 2013-08-05 DIAGNOSIS — S6980XA Other specified injuries of unspecified wrist, hand and finger(s), initial encounter: Secondary | ICD-10-CM | POA: Insufficient documentation

## 2013-08-05 DIAGNOSIS — M24139 Other articular cartilage disorders, unspecified wrist: Secondary | ICD-10-CM

## 2013-08-05 DIAGNOSIS — G5701 Lesion of sciatic nerve, right lower limb: Secondary | ICD-10-CM | POA: Insufficient documentation

## 2013-08-05 MED ORDER — METHYLPREDNISOLONE ACETATE 40 MG/ML IJ SUSP
40.0000 mg | Freq: Once | INTRAMUSCULAR | Status: AC
  Administered 2013-08-05: 40 mg via INTRAMUSCULAR

## 2013-08-05 NOTE — Assessment & Plan Note (Signed)
Patient with evidence of a degenerative meniscal injury and mild effusion today. Given a body helix compression sleeve. Advised to wear the sleeve during activities including tennis.

## 2013-08-05 NOTE — Progress Notes (Signed)
Patient ID: Ashlee Robbins, female   DOB: 1961/06/11, 52 y.o.   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  She's had injections into her right performance in the past and feels that she needs one today. Her symptoms have been progressively worsening. No new injury. She has been doing home stretching this has not completely resolved her symptoms.  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 3 stretching she felt a popping sensation and had tenderness to palpation back behind the knee for the past several days. Other than oral medication she has not attempted any therapeutic measures. She is currently on Celebrex.  Past Medical History  Diagnosis Date  . Breast cancer screening, high risk patient 12/09/2012   No past surgical history on file. No Known Allergies  Review of systems as per history of present illness otherwise negative.  Examination: BP 136/94  Ht 5\' 6"  (1.676 m)  Wt 150 lb (68.04 kg)  BMI 24.22 kg/m2 Well-developed well-nourished 52 year old female awake alert and oriented in no acute distress  Mild tenderness to palpation of the right performance muscle. Hip abduction strength 5 over 5 and equal bilaterally. Hip flexion and extension 5 over 5 as well.  Knee: Normal to inspection with no erythema or effusion or obvious bony abnormalities. Palpation  with no warmth, patellar tenderness, or condyle tenderness. Mild tenderness to palpation of the posterior lateral joint line and semi-tendinosis tendon and membranous is tendons ROM full in flexion and extension and lower leg  rotation. Ligaments with solid consistent endpoints including ACL, PCL, LCL, MCL. Negative Mcmurray's Non painful patellar compression. Patellar glide with crepitus. Patellar and quadriceps tendons unremarkable. Hamstring and quadriceps strength is normal.   Neurovascularly intact bilateral lower cavities.  Musculoskeletal ultrasound of the right knee was obtained today. Evidence revealed suprapatellar pouch fluid blood quadriceps tendon. Is also evidence of some fluid within and around the posterior joint line. Degenerative changes of the lateral meniscus were noted.  Musculoskeletal ultrasound with ultrasound guidance for injection into the piriformis muscle was performed. After localization of the piriformis muscle and visualization of calcific changes within the muscle were done, the site was cleaned and prepped using sterile technique and a 3-1 Xylocaine and Depo-Medrol injection was given into the piriformis muscle. Informed consent was obtained prior to the procedure. The patient tolerated the procedure well. Images both pre-and during injection were obtained.

## 2013-08-05 NOTE — Patient Instructions (Signed)
Your piriformis has some calcium deposits that we injected today  Do 2 piriformis stretches for 30 secs - repeat 3 times at least once daily  Hip rotation standing Straight leg raises and lateral and inside leg raises  Use compression sleeve for knee  Call [place] or Ebony Cargo at [employer]  Tell them I think you need to change your forehand to more western grip to stop damage to lateral wrist

## 2013-08-05 NOTE — Assessment & Plan Note (Signed)
Ultrasound-guided injection was given today. She'll followup as needed.

## 2013-08-05 NOTE — Assessment & Plan Note (Signed)
It was recommended that she contact a tennis pro who receive coaching to change her grip toe Western grip and help alleviate lateral wrist pain.

## 2013-08-24 ENCOUNTER — Other Ambulatory Visit: Payer: Self-pay

## 2013-08-24 DIAGNOSIS — Z1231 Encounter for screening mammogram for malignant neoplasm of breast: Secondary | ICD-10-CM

## 2013-09-14 ENCOUNTER — Other Ambulatory Visit: Payer: Self-pay | Admitting: *Deleted

## 2013-09-14 MED ORDER — NITROGLYCERIN 0.2 MG/HR TD PT24: MEDICATED_PATCH | TRANSDERMAL | Status: DC

## 2013-10-05 ENCOUNTER — Ambulatory Visit: Payer: BC Managed Care – PPO | Admitting: Sports Medicine

## 2013-10-06 ENCOUNTER — Ambulatory Visit: Payer: BC Managed Care – PPO

## 2013-10-27 ENCOUNTER — Ambulatory Visit
Admission: RE | Admit: 2013-10-27 | Discharge: 2013-10-27 | Disposition: A | Discharge status: Home / Self Care | Payer: BC Managed Care – PPO | Source: Ambulatory Visit

## 2013-10-27 DIAGNOSIS — Z1231 Encounter for screening mammogram for malignant neoplasm of breast: Secondary | ICD-10-CM

## 2013-11-24 ENCOUNTER — Other Ambulatory Visit: Payer: BC Managed Care – PPO | Admitting: Emergency Medicine

## 2013-12-09 ENCOUNTER — Telehealth: Payer: Self-pay | Admitting: *Deleted

## 2013-12-09 ENCOUNTER — Ambulatory Visit (HOSPITAL_BASED_OUTPATIENT_CLINIC_OR_DEPARTMENT_OTHER): Payer: BC Managed Care – PPO | Admitting: Oncology

## 2013-12-09 ENCOUNTER — Other Ambulatory Visit (HOSPITAL_BASED_OUTPATIENT_CLINIC_OR_DEPARTMENT_OTHER): Payer: BC Managed Care – PPO

## 2013-12-09 VITALS — BP 132/89 | HR 77 | Temp 97.9°F | Resp 20 | Ht 66.0 in | Wt 152.7 lb

## 2013-12-09 DIAGNOSIS — Z1239 Encounter for other screening for malignant neoplasm of breast: Secondary | ICD-10-CM

## 2013-12-09 DIAGNOSIS — Z803 Family history of malignant neoplasm of breast: Secondary | ICD-10-CM

## 2013-12-09 DIAGNOSIS — Z1231 Encounter for screening mammogram for malignant neoplasm of breast: Secondary | ICD-10-CM

## 2013-12-09 NOTE — Progress Notes (Signed)
OFFICE PROGRESS NOTE  CC  [name],[name], MD No address on file Dr. Jackolyn Confer Dr. Sudie Bailey  DIAGNOSIS: 53 year old female seen in the high-risk clinic for ongoing discussions about breast cancer risk reduction.  PRIOR THERAPY:  #1 seen in the high risk clinic in 2011 We did risk assessment and her cancer risk was estimated at 20-25%.  #2 patient has had BRCA1 and 2 testing performed which was negative. She also had MEN2 gene RET done as well that was negative.  #3 patient was recommended Evista on a daily basis but she could not tolerate it and she discontinued it.  #3 she does get yearly MRIs as well as mammograms as part of her ongoing surveillance. She will be due for a mammogram  Sometime this year. By recommendation would also be for her to get yearly MRIs performed as well. However patient was concerned by the radiation exposure and has not had a MRI for a few years.  CURRENT THERAPY:Observation and radiographic surveillance with mammograms and MRIs on a yearly basis.  INTERVAL HISTORY: Ashlee Robbins 53 y.o. female returns for Followup visit today. Overall she is doing well. She denies any fevers chills night sweats headaches shortness of breath chest pains palpitations she has no myalgias or arthralgias. She does not have any evidence of any breast masses. She continues to be followed by her primary care physician as well as her gynecologist. She is very active she continues to play tennis avidly. Remainder of the 10 point review of systems is negative.  MEDICAL HISTORY: Past Medical History  Diagnosis Date  . Breast cancer screening, high risk patient 12/09/2012    ALLERGIES:  has No Known Allergies.  MEDICATIONS:  Current Outpatient Prescriptions  Medication Sig Dispense Refill  . amLODipine (NORVASC) 5 MG tablet       . CELEBREX 200 MG capsule       . ciprofloxacin (CIPRO) 500 MG tablet       . losartan (COZAAR) 100 MG tablet       . nitroGLYCERIN  (NITRODUR - DOSED IN MG/24 HR) 0.2 mg/hr patch Use 1/4 of a patch every 24 hours changing placement ea time  30 patch  1  . Vitamin D, Ergocalciferol, (DRISDOL) 50000 UNITS CAPS capsule        No current facility-administered medications for this visit.    SURGICAL HISTORY: No past surgical history on file.  REVIEW OF SYSTEMS:  Pertinent items are noted in HPI.   PHYSICAL EXAMINATION: General appearance: alert, cooperative and appears stated age Neck: no adenopathy, no carotid bruit, no JVD, supple, symmetrical, trachea midline and thyroid not enlarged, symmetric, no tenderness/mass/nodules Lymph nodes: Cervical, supraclavicular, and axillary nodes normal. Resp: clear to auscultation bilaterally and normal percussion bilaterally Back: symmetric, no curvature. ROM normal. No CVA tenderness. Cardio: regular rate and rhythm, S1, S2 normal, no murmur, click, rub or gallop GI: soft, non-tender; bowel sounds normal; no masses,  no organomegaly Extremities: extremities normal, atraumatic, no cyanosis or edema Neurologic: Alert and oriented X 3, normal strength and tone. Normal symmetric reflexes. Normal coordination and gait Bilateral breast examinations: There are no palpable masses nipple discharge or skin changes bilaterally. ECOG PERFORMANCE STATUS: 0 - Asymptomatic  Blood pressure 132/89, pulse 77, temperature 97.9 F (36.6 C), temperature source Oral, resp. rate 20, height 5' 6" (1.676 m), weight 152 lb 11.2 oz (69.264 kg).  LABORATORY DATA: Lab Results  Component Value Date   WBC 5.6 12/09/2012   HGB 14.3 12/09/2012  HCT 41.9 12/09/2012   MCV 100.0 12/09/2012   PLT 284 12/09/2012      Chemistry      Component Value Date/Time   NA 144 12/09/2012 1230   NA 140 11/26/2010 1339   K 4.0 12/09/2012 1230   K 4.2 11/26/2010 1339   CL 107 12/09/2012 1230   CL 101 11/26/2010 1339   CO2 28 12/09/2012 1230   CO2 26 11/26/2010 1339   BUN 21.9 12/09/2012 1230   BUN 16 11/26/2010 1339   CREATININE 1.0  12/09/2012 1230   CREATININE 0.91 11/26/2010 1339      Component Value Date/Time   CALCIUM 9.3 12/09/2012 1230   CALCIUM 10.1 11/26/2010 1339   ALKPHOS 54 12/09/2012 1230   ALKPHOS 44 11/26/2010 1339   AST 21 12/09/2012 1230   AST 26 11/26/2010 1339   ALT 19 12/09/2012 1230   ALT 18 11/26/2010 1339   BILITOT 0.59 12/09/2012 1230   BILITOT 0.6 11/26/2010 1339       RADIOGRAPHIC STUDIES:  No results found.  ASSESSMENT: 53 year old female at high-risk for developing breast cancer due to her family history and 5 this evaluation breast cancer risk estimated at 20-25%. Patient has had BRCA1 and 2 testing performed which was negative for both of the mutations. Overall she is doing well. Vision has been tried on Evista but she could not tolerate it and now she is on observation only.   PLAN:   #1 patient will continue getting annual 3D mammograms.  2. We discussed getting MRI of the breasts again but she is saying that she will discuss this with her husband and most likely will like to have it next year  3. Patient will be seen back in 1 year or sooner.  All questions were answered. The patient knows to call the clinic with any problems, questions or concerns. We can certainly see the patient much sooner if necessary.  I spent 20 minutes counseling the patient face to face. The total time spent in the appointment was 35 minutes.    Marcy Panning, MD Medical/Oncology [facility] [phone] (beeper) [phone] (Office)  12/09/2013, 3:02 PM

## 2013-12-09 NOTE — Telephone Encounter (Signed)
appts made and printed...td 

## 2013-12-12 ENCOUNTER — Encounter: Payer: Self-pay | Admitting: Oncology

## 2013-12-21 ENCOUNTER — Ambulatory Visit (INDEPENDENT_AMBULATORY_CARE_PROVIDER_SITE_OTHER): Payer: BC Managed Care – PPO | Admitting: Gynecology

## 2013-12-21 ENCOUNTER — Encounter: Payer: Self-pay | Admitting: Gynecology

## 2013-12-21 VITALS — BP 132/86 | Ht 66.0 in | Wt 150.0 lb

## 2013-12-21 DIAGNOSIS — N9089 Other specified noninflammatory disorders of vulva and perineum: Secondary | ICD-10-CM

## 2013-12-21 DIAGNOSIS — L723 Sebaceous cyst: Secondary | ICD-10-CM

## 2013-12-21 DIAGNOSIS — L729 Follicular cyst of the skin and subcutaneous tissue, unspecified: Secondary | ICD-10-CM | POA: Insufficient documentation

## 2013-12-21 MED ORDER — DOXYCYCLINE HYCLATE 100 MG PO CAPS: 100.0000 mg | ORAL_CAPSULE | Freq: Two times a day (BID) | ORAL | Status: DC

## 2013-12-21 NOTE — Patient Instructions (Signed)
Folliculitis  Folliculitis is redness, soreness, and swelling (inflammation) of the hair follicles. This condition can occur anywhere on the body. People with weakened immune systems, diabetes, or obesity have a greater risk of getting folliculitis. CAUSES  Bacterial infection. This is the most common cause.  Fungal infection.  Viral infection.  Contact with certain chemicals, especially oils and tars. Long-term folliculitis can result from bacteria that live in the nostrils. The bacteria may trigger multiple outbreaks of folliculitis over time. SYMPTOMS Folliculitis most commonly occurs on the scalp, thighs, legs, back, buttocks, and areas where hair is shaved frequently. An early sign of folliculitis is a small, white or yellow, pus-filled, itchy lesion (pustule). These lesions appear on a red, inflamed follicle. They are usually less than 0.2 inches (5 mm) wide. When there is an infection of the follicle that goes deeper, it becomes a boil or furuncle. A group of closely packed boils creates a larger lesion (carbuncle). Carbuncles tend to occur in hairy, sweaty areas of the body. DIAGNOSIS  Your caregiver can usually tell what is wrong by doing a physical exam. A sample may be taken from one of the lesions and tested in a lab. This can help determine what is causing your folliculitis. TREATMENT  Treatment may include:  Applying warm compresses to the affected areas.  Taking antibiotic medicines orally or applying them to the skin.  Draining the lesions if they contain a large amount of pus or fluid.  Laser hair removal for cases of long-lasting folliculitis. This helps to prevent regrowth of the hair. HOME CARE INSTRUCTIONS  Apply warm compresses to the affected areas as directed by your caregiver.  If antibiotics are prescribed, take them as directed. Finish them even if you start to feel better.  You may take over-the-counter medicines to relieve itching.  Do not shave  irritated skin.  Follow up with your caregiver as directed. SEEK IMMEDIATE MEDICAL CARE IF:   You have increasing redness, swelling, or pain in the affected area.  You have a fever. MAKE SURE YOU:  Understand these instructions.  Will watch your condition.  Will get help right away if you are not doing well or get worse. Document Released: 12/16/2001 Document Revised: 04/07/2012 Document Reviewed: 01/07/2012 [employer] Patient Information 2014 [employer].  

## 2013-12-21 NOTE — Progress Notes (Signed)
   The patient is a 53 year old who presented to the office today concerned about this nodular area she had noted in the area of the vagina. She is happily married in a monogamous relationship. She has not been seen in the office is 2011. Patient stated that she has been getting her gynecological exams and [place] smears on a regular basis [place] [facility]. Patient denies any vaginal discharge. 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 She is on no hormone replacement therapy and has had a previous tubal ligation.  Exam: Bartholin's urethra Skene was within normal limits the area the patient was described as a subdermal cyst like region at the area of the fourchette and nontender nonerythematous. Patient was requesting that it be removed.  The area was cleansed with Betadine solution. One percent lidocaine was infiltrated subdermally. With a scalpel an incision was made directly over the cyst like area and minimal fluid in any came out and clear. With the curved hemostats the loculations were broken down. The area was debrided with hydrogen peroxide. A small wick of gauze was placed for hemostasis.  Assessment/plan: Fourchette follicular cyst. Patient to removed the gauze wick tomorrow. For prophylaxis she'll be placed on Vibramycin 100 mg one by mouth twice a day for 7 days. She also will apply Neosporin twice a day. Patient states that she'll continue to have her for gynecological examinations and Pap smear with her PCP so this was not done today. She would like to continue to see us I. she did several years ago she is more than welcome. She has been receiving her mammograms and colonoscopy and Pap smears through her PCP.

## 2013-12-23 ENCOUNTER — Telehealth: Payer: Self-pay | Admitting: *Deleted

## 2013-12-23 MED ORDER — FLUCONAZOLE 150 MG PO TABS: 150.0000 mg | ORAL_TABLET | Freq: Once | ORAL | Status: DC

## 2013-12-23 NOTE — Telephone Encounter (Signed)
Pt was seen on 12/21/13 given Rx for Vibramycin 100 mg one by mouth twice a day for 7 days, spoke with you about having Rx for yeast due to taking vibramycin. Pt said rx was not at pharmacy for yeast. Please advise

## 2013-12-23 NOTE — Telephone Encounter (Signed)
Rx sent, pt informed. 

## 2013-12-23 NOTE — Telephone Encounter (Signed)
Please call a prescription for Diflucan 150 mg one by mouth refill x1

## 2014-01-03 ENCOUNTER — Ambulatory Visit: Payer: Self-pay | Admitting: Gynecology

## 2014-03-09 ENCOUNTER — Encounter: Payer: Self-pay | Admitting: Sports Medicine

## 2014-03-09 ENCOUNTER — Ambulatory Visit (INDEPENDENT_AMBULATORY_CARE_PROVIDER_SITE_OTHER): Payer: BC Managed Care – PPO | Admitting: Sports Medicine

## 2014-03-09 VITALS — BP 151/106 | Ht 66.0 in | Wt 150.0 lb

## 2014-03-09 DIAGNOSIS — G5700 Lesion of sciatic nerve, unspecified lower limb: Secondary | ICD-10-CM

## 2014-03-09 DIAGNOSIS — G5701 Lesion of sciatic nerve, right lower limb: Secondary | ICD-10-CM

## 2014-03-09 NOTE — Progress Notes (Signed)
Patient ID: Ashlee JohnsLisa Domagala, female   DOB: 12/11/1960, 53 y.o.   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 She states that the last ultrasound guided injection I gave her lasted almost a year with good relief for the first 6 months She plays tennis and does sometimes flares her symptoms Also does fitness classes  She has stopped doing home stretches and exercises  Hurting  so she went to see orthopedist before coming here Injection w fluoro on 4/29 She says this has given her 85% relief but not as much as US guided injection  Pain primarily occurs on trying to turn or run to the right  Physical examination No acute distress BP 151/106  Ht 5\' 6"  (1.676 m)  Wt 150 lb (68.04 kg)  BMI 24.22 kg/m2  Bilat. Hip range of motion is full  RT Hip:  Strength IR: 5/5, ER: 5/5, Flexion: 5/5, Extension: 5/5, Abduction: 5/5, Adduction: 5/5 Pelvic alignment unremarkable to inspection and palpation. Standing hip rotation and gait without trendelenburg / unsteadiness. Greater trochanter without tenderness to palpation. Localized tenderness over piriformis  No TTP  greater trochanter. No SI joint tenderness and normal minimal SI movement.

## 2014-03-09 NOTE — Assessment & Plan Note (Signed)
I suggest we try a course of physical therapy  I think she would benefit from deep friction massage and a directed  exercise program I did not find evidence of weakness I would like her to restart some piriformis stretches  I advised that we should not reinject this before August 1  RTC 3 ms

## 2014-05-24 ENCOUNTER — Ambulatory Visit (INDEPENDENT_AMBULATORY_CARE_PROVIDER_SITE_OTHER): Payer: BC Managed Care – PPO | Admitting: Sports Medicine

## 2014-05-24 ENCOUNTER — Encounter: Payer: Self-pay | Admitting: Sports Medicine

## 2014-05-24 VITALS — BP 120/85 | HR 73 | Ht 65.0 in | Wt 150.0 lb

## 2014-05-24 DIAGNOSIS — G5700 Lesion of sciatic nerve, unspecified lower limb: Secondary | ICD-10-CM

## 2014-05-24 DIAGNOSIS — M719 Bursopathy, unspecified: Secondary | ICD-10-CM

## 2014-05-24 DIAGNOSIS — M7581 Other shoulder lesions, right shoulder: Secondary | ICD-10-CM | POA: Insufficient documentation

## 2014-05-24 DIAGNOSIS — G5701 Lesion of sciatic nerve, right lower limb: Secondary | ICD-10-CM

## 2014-05-24 DIAGNOSIS — M67919 Unspecified disorder of synovium and tendon, unspecified shoulder: Secondary | ICD-10-CM

## 2014-05-24 MED ORDER — METHYLPREDNISOLONE ACETATE 40 MG/ML IJ SUSP
40.0000 mg | Freq: Once | INTRAMUSCULAR | Status: AC
  Administered 2014-05-24: 40 mg via INTRA_ARTICULAR

## 2014-05-24 MED ORDER — NITROGLYCERIN 0.2 MG/HR TD PT24: 0.2000 mg | MEDICATED_PATCH | Freq: Every day | TRANSDERMAL | Status: DC

## 2014-05-24 NOTE — Assessment & Plan Note (Signed)
Procedure:  Injection of Consent obtained and verified. Time-out conducted. Noted no overlying erythema, induration, or other signs of local infection. Skin prepped in a sterile fashion. Topical analgesic spray: not used Completed without difficulty. Meds:1 cc solumedrol 40 and 4 cc lidocaine Injected into scar tissue at 2 cm deep in piriformis MM under US guidance Pain immediately improved suggesting accurate placement of the medication. Advised to call if fevers/chills, erythema, induration, drainage, or persistent bleeding.  Keep up massage Keep up stretches  Prn recheck

## 2014-05-24 NOTE — Assessment & Plan Note (Signed)
Started HEP Started NTG patches  Reck 2 mos

## 2014-05-24 NOTE — Progress Notes (Signed)
   Subjective:    Patient ID: Ashlee JohnsLisa Robbins, female    DOB: 09/19/1961, 53 y.o.   MRN: [id number]  HPI Ashlee Robbins is a 53 yo female who presents for follow up of piriformis syndrome. She describes that her piriformis is tight and that the shot she received 2 visits ago is "wearing off". This is most noticeable when she plays tennis. Deep tissue massage has provided great relief and she continues to go to her massage therapist. She no longer does PT since it provided minimal relief.  Ashlee Robbins also complains of right shoulder pain over the anterior portion of her humeral head. She describes the pain as a nagging, achy pain that is most noticeable when she lays on her right shoulder. Her daily activities, including tennis and golf, do not elicit the pain. She recently saw [facility] who gave her a shot which gave her minimal relief.    Review of Systems     Objective:   Physical Exam General: well-dressed, well-nourished woman sitting comfortably on exam table. Shoulder exam: symmetric and no deformities on inspection. Mild tenderness to deep palpation at supraspinatus insertion on humeral head on right shoulder. Active range of motion fully intact bilaterally. Mild pain with empty can test on right. Strength fully intact with external and internal rotation.  Hawkins and Neer test neg Speeds and yergason neg  Hip exam full rom and good strength  TTP deep over pirformis  US  There is some increased hypoechoic change in Supsp tendon as well as increase in doppler flow Other RC tendons intact AC joint only mild DJD BT norm  Piriformis on US shows a focal area of hyperechoic change that is circular Suggestive of scar tissue       Assessment & Plan:  **Piriformis syndrome:  - ultrasound-guided steroid injection.   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

## 2014-07-05 ENCOUNTER — Ambulatory Visit: Payer: BC Managed Care – PPO | Admitting: Sports Medicine

## 2014-07-06 ENCOUNTER — Encounter: Payer: Self-pay | Admitting: Sports Medicine

## 2014-07-06 ENCOUNTER — Ambulatory Visit (INDEPENDENT_AMBULATORY_CARE_PROVIDER_SITE_OTHER): Payer: BC Managed Care – PPO | Admitting: Sports Medicine

## 2014-07-06 VITALS — BP 141/94 | HR 76 | Ht 65.0 in | Wt 150.0 lb

## 2014-07-06 DIAGNOSIS — M7581 Other shoulder lesions, right shoulder: Secondary | ICD-10-CM

## 2014-07-06 DIAGNOSIS — M67919 Unspecified disorder of synovium and tendon, unspecified shoulder: Secondary | ICD-10-CM

## 2014-07-06 DIAGNOSIS — G5700 Lesion of sciatic nerve, unspecified lower limb: Secondary | ICD-10-CM | POA: Diagnosis not present

## 2014-07-06 DIAGNOSIS — M719 Bursopathy, unspecified: Secondary | ICD-10-CM

## 2014-07-06 DIAGNOSIS — G5701 Lesion of sciatic nerve, right lower limb: Secondary | ICD-10-CM

## 2014-07-06 MED ORDER — TRIAMCINOLONE ACETONIDE 10 MG/ML IJ SUSP
10.0000 mg | Freq: Once | INTRAMUSCULAR | Status: AC
  Administered 2014-07-06: 10 mg via INTRA_ARTICULAR

## 2014-07-06 MED ORDER — NITROGLYCERIN 0.2 MG/HR TD PT24: 0.2000 mg | MEDICATED_PATCH | Freq: Every day | TRANSDERMAL | Status: DC

## 2014-07-06 NOTE — Assessment & Plan Note (Signed)
Procedure:  Injection of right subacromial space of shoulder Consent obtained and verified. Time-out conducted. Noted no overlying erythema, induration, or other signs of local infection. Skin prepped in a sterile fashion. Topical analgesic spray: Ethyl chloride. Completed without difficulty.  I approached the injection from the posterior window Meds: Kenalog 10 1 cc and 4 cc lidocaine 1% Pain immediately improved suggesting accurate placement of the medication.  I advised her that she has had several injections so we don't want to do too many and we limited the dose of steroid  She needs to start her rehabilitation exercises which she really didn't do after last visit  Continue nitroglycerin which gave her significant relief but probably is not going to fully work unless she also does the exercises  Recheck in about 6 weeks  For the next 3 days limit activity to easy shoulder motion  Call with any side effects or issues Advised to call if fevers/chills, erythema, induration, drainage, or persistent bleeding.

## 2014-07-06 NOTE — Progress Notes (Signed)
Patient ID: Ashlee Robbins, female   DOB: [birth year], 53 y.o.   MRN: [id number]  Alessia plays a lot of tennis In mid July she started having right shoulder pain We started her on nitroglycerin and she had significant improvement However, she also had an injection for piriformis syndrome with corticosteroid Piriformis injection gave her substantial relief of all symptoms  Some of the aching and pain is coming back in the right shoulder pain which comes in wondering about injection for this Strength felt okay but she has not done her rehabilitation exercises Ultrasound on last visit did show hypoechoic change around the supraspinatous tendon  Examination Muscular female in no acute distress BP 141/94  Pulse 76  Ht  (1.651 m)  Wt 150 lb (68.04 kg)  BMI 24.96 kg/m2  Shoulder: Inspection reveals no abnormalities, atrophy or asymmetry. Palpation is normal with no tenderness over AC joint or bicipital groove. ROM is full in all planes. Rotator cuff strength normal throughout. No signs of impingement with negative Neer and Hawkin's tests, empty can. Speeds and Yergason's tests normal. No labral pathology noted with negative Obrien's, negative clunk and good stability. Normal scapular function observed. No painful arc and no drop arm sign. No apprehension sign  Her only symptoms today seem to be with resisted supraspinatous testing

## 2014-08-03 ENCOUNTER — Telehealth: Payer: Self-pay | Admitting: Hematology and Oncology

## 2014-08-03 NOTE — Telephone Encounter (Signed)
Lvm advising appt chg from 2/18 (md pal) to 3/10. Mailed new appt calendar.

## 2014-08-16 ENCOUNTER — Ambulatory Visit: Payer: BC Managed Care – PPO | Admitting: Sports Medicine

## 2014-08-22 ENCOUNTER — Encounter: Payer: Self-pay | Admitting: Sports Medicine

## 2014-11-01 ENCOUNTER — Other Ambulatory Visit: Payer: Self-pay

## 2014-11-01 DIAGNOSIS — Z1231 Encounter for screening mammogram for malignant neoplasm of breast: Secondary | ICD-10-CM

## 2014-11-17 ENCOUNTER — Ambulatory Visit
Admission: RE | Admit: 2014-11-17 | Discharge: 2014-11-17 | Disposition: A | Discharge status: Home / Self Care | Payer: BLUE CROSS/BLUE SHIELD | Source: Ambulatory Visit

## 2014-11-17 ENCOUNTER — Other Ambulatory Visit: Payer: Self-pay

## 2014-11-17 DIAGNOSIS — Z1231 Encounter for screening mammogram for malignant neoplasm of breast: Secondary | ICD-10-CM

## 2014-12-08 ENCOUNTER — Ambulatory Visit: Payer: BC Managed Care – PPO | Admitting: Hematology and Oncology

## 2014-12-09 ENCOUNTER — Ambulatory Visit: Payer: BC Managed Care – PPO | Admitting: Oncology

## 2014-12-29 ENCOUNTER — Ambulatory Visit: Payer: BC Managed Care – PPO | Admitting: Hematology and Oncology

## 2014-12-29 ENCOUNTER — Other Ambulatory Visit: Payer: Self-pay

## 2015-01-05 ENCOUNTER — Other Ambulatory Visit: Payer: Self-pay | Admitting: *Deleted

## 2015-01-05 ENCOUNTER — Telehealth: Payer: Self-pay | Admitting: *Deleted

## 2015-01-05 ENCOUNTER — Telehealth: Payer: Self-pay | Admitting: Hematology and Oncology

## 2015-01-05 NOTE — Telephone Encounter (Signed)
Received telephone advice record, sent to scan. pof sent to reschedule missed appt per patient request.

## 2015-01-05 NOTE — Telephone Encounter (Signed)
Called patient and left message with new appointment.

## 2015-02-01 ENCOUNTER — Other Ambulatory Visit: Payer: Self-pay | Admitting: *Deleted

## 2015-02-01 ENCOUNTER — Ambulatory Visit: Payer: BLUE CROSS/BLUE SHIELD | Admitting: Hematology and Oncology

## 2015-02-01 ENCOUNTER — Telehealth: Payer: Self-pay | Admitting: Hematology and Oncology

## 2015-02-01 NOTE — Telephone Encounter (Signed)
Called and left a message with a new appointment °

## 2015-02-24 ENCOUNTER — Ambulatory Visit (HOSPITAL_BASED_OUTPATIENT_CLINIC_OR_DEPARTMENT_OTHER): Payer: BLUE CROSS/BLUE SHIELD | Admitting: Hematology and Oncology

## 2015-02-24 ENCOUNTER — Telehealth: Payer: Self-pay | Admitting: Hematology and Oncology

## 2015-02-24 VITALS — BP 128/91 | HR 69 | Temp 97.9°F | Resp 18 | Ht 65.0 in | Wt 155.5 lb

## 2015-02-24 DIAGNOSIS — Z1239 Encounter for other screening for malignant neoplasm of breast: Secondary | ICD-10-CM | POA: Diagnosis not present

## 2015-02-24 DIAGNOSIS — Z803 Family history of malignant neoplasm of breast: Secondary | ICD-10-CM

## 2015-02-24 NOTE — Progress Notes (Signed)
Patient Care Team: Madelaine Bhat, MD as PCP - General (Family Medicine)  DIAGNOSIS: high risk for breast cancer based on family history.  CHIEF COMPLIANT: follow-up for breast exams and surveillance  INTERVAL HISTORY: Ashlee Robbins is a 54 year old lady with above-mentioned history of high risk for breast cancer based on family history presented for annual follow-up. She reports no new problems or complaints. She denies any lumps or nodules in the breasts.  REVIEW OF SYSTEMS:   Constitutional: Denies fevers, chills or abnormal weight loss Eyes: Denies blurriness of vision Ears, nose, mouth, throat, and face: Denies mucositis or sore throat Respiratory: Denies cough, dyspnea or wheezes Cardiovascular: Denies palpitation, chest discomfort or lower extremity swelling Gastrointestinal:  Denies nausea, heartburn or change in bowel habits Skin: Denies abnormal skin rashes Lymphatics: Denies new lymphadenopathy or easy bruising Neurological:Denies numbness, tingling or new weaknesses Behavioral/Psych: Mood is stable, no new changes  Breast:  denies any pain or lumps or nodules in either breasts All other systems were reviewed with the patient and are negative.  I have reviewed the past medical history, past surgical history, social history and family history with the patient and they are unchanged from previous note.  ALLERGIES:  has No Known Allergies.  MEDICATIONS:  Current Outpatient Prescriptions  Medication Sig Dispense Refill  . amLODipine (NORVASC) 5 MG tablet     . lisinopril-hydrochlorothiazide (PRINZIDE,ZESTORETIC) 10-12.5 MG per tablet Take by mouth.    Marland Kitchen MAGNESIUM-OXIDE 400 (241.3 MG) MG tablet   3  . Vitamin D, Ergocalciferol, (DRISDOL) 50000 UNITS CAPS capsule     . diazepam (VALIUM) 5 MG tablet      No current facility-administered medications for this visit.    PHYSICAL EXAMINATION: ECOG PERFORMANCE STATUS: 0 - Asymptomatic  Filed Vitals:   02/24/15 1024  BP:  128/91  Pulse: 69  Temp: 97.9 F (36.6 C)  Resp: 18   Filed Weights   02/24/15 1024  Weight: 155 lb 8 oz (70.534 kg)    GENERAL:alert, no distress and comfortable SKIN: skin color, texture, turgor are normal, no rashes or significant lesions EYES: normal, Conjunctiva are pink and non-injected, sclera clear OROPHARYNX:no exudate, no erythema and lips, buccal mucosa, and tongue normal  NECK: supple, thyroid normal size, non-tender, without nodularity LYMPH:  no palpable lymphadenopathy in the cervical, axillary or inguinal LUNGS: clear to auscultation and percussion with normal breathing effort HEART: regular rate & rhythm and no murmurs and no lower extremity edema ABDOMEN:abdomen soft, non-tender and normal bowel sounds Musculoskeletal:no cyanosis of digits and no clubbing  NEURO: alert & oriented x 3 with fluent speech, no focal motor/sensory deficits BREAST: No palpable masses or nodules in either right or left breasts. No palpable axillary supraclavicular or infraclavicular adenopathy no breast tenderness or nipple discharge. (exam performed in the presence of a chaperone)  LABORATORY DATA:  I have reviewed the data as listed   Chemistry      Component Value Date/Time   NA 144 12/09/2012 1230   NA 140 11/26/2010 1339   K 4.0 12/09/2012 1230   K 4.2 11/26/2010 1339   CL 107 12/09/2012 1230   CL 101 11/26/2010 1339   CO2 28 12/09/2012 1230   CO2 26 11/26/2010 1339   BUN 21.9 12/09/2012 1230   BUN 16 11/26/2010 1339   CREATININE 1.0 12/09/2012 1230   CREATININE 0.91 11/26/2010 1339      Component Value Date/Time   CALCIUM 9.3 12/09/2012 1230   CALCIUM 10.1 11/26/2010 1339   ALKPHOS  54 12/09/2012 1230   ALKPHOS 44 11/26/2010 1339   AST 21 12/09/2012 1230   AST 26 11/26/2010 1339   ALT 19 12/09/2012 1230   ALT 18 11/26/2010 1339   BILITOT 0.59 12/09/2012 1230   BILITOT 0.6 11/26/2010 1339       Lab Results  Component Value Date   WBC 5.6 12/09/2012   HGB 14.3  12/09/2012   HCT 41.9 12/09/2012   MCV 100.0 12/09/2012   PLT 284 12/09/2012   NEUTROABS 3.5 12/09/2012    ASSESSMENT & PLAN:  Breast cancer screening, high risk patient High-risk for developing breast cancer due to her family history and 5 this evaluation breast cancer risk estimated at 20-25%. Patient has had BRCA1 and 2 testing performed which was negative for both. Could not tolerate Evista currently on surveillance.  Breast Cancer Surveillance: 1. Breast exam 02/23/14: Normal 2. Mammogram 11/17/14 No abnormalities.  Breast Density Category C. I recommended that she get 3-D mammograms for surveillance. After lengthy discussion we elected to obtain a breast MRI next year in 2017. Discussed the differences between different breast density categories.   Return to clinic in 1 year    Orders Placed This Encounter  Procedures  . MR Breast Bilateral W Wo Contrast    Standing Status: Future     Number of Occurrences:      Standing Expiration Date: 04/25/2016    Order Specific Question:  Reason for Exam (SYMPTOM  OR DIAGNOSIS REQUIRED)    Answer:  High risk breast cancer screening Risk >20-25%    Order Specific Question:  Preferred imaging location?    Answer:  [id number]    Order Specific Question:  Does the patient have a pacemaker or implanted devices?    Answer:  No    Order Specific Question:  What is the patient's sedation requirement?    Answer:  No Sedation   The patient has a good understanding of the overall plan. she agrees with it. she will call with any problems that may develop before the next visit here.   Rulon Eisenmenger, MD

## 2015-02-24 NOTE — Assessment & Plan Note (Signed)
High-risk for developing breast cancer due to her family history and 5 this evaluation breast cancer risk estimated at 20-25%. Patient has had BRCA1 and 2 testing performed which was negative for both. Could not tolerate Evista currently on surveillance.  Breast Cancer Surveillance: 1. Breast exam 02/23/14: Normal 2. Mammogram 11/17/17 No abnormalities.  Breast Density Category C. I recommended that she get 3-D mammograms for surveillance. Discussed the differences between different breast density categories.   Return to clinic in 1 year

## 2015-02-24 NOTE — Telephone Encounter (Signed)
Appointments made and avs printed for patient °

## 2015-06-20 ENCOUNTER — Ambulatory Visit: Payer: BLUE CROSS/BLUE SHIELD | Admitting: Sports Medicine

## 2015-06-30 ENCOUNTER — Encounter: Payer: Self-pay | Admitting: Genetic Counselor

## 2015-07-26 ENCOUNTER — Telehealth: Payer: Self-pay

## 2015-07-26 NOTE — Telephone Encounter (Signed)
LMOVM - Returning patient call re: MRI scheduling.  Let pt know Dr. Pamelia Hoit ordered MRI for April of 2017 so it would be available for OV May 2017.  Pt to call clinic if she has any questions.

## 2015-08-03 ENCOUNTER — Other Ambulatory Visit: Payer: Self-pay | Admitting: *Deleted

## 2015-08-03 ENCOUNTER — Telehealth: Payer: Self-pay | Admitting: *Deleted

## 2015-08-03 ENCOUNTER — Other Ambulatory Visit: Payer: Self-pay | Admitting: Hematology and Oncology

## 2015-08-03 DIAGNOSIS — Z1239 Encounter for other screening for malignant neoplasm of breast: Secondary | ICD-10-CM

## 2015-08-03 NOTE — Telephone Encounter (Signed)
Patient called wanting to have MRI done as she has met her deductible for the year. Spoke with Dr. Lindi Adie, ok to have MRI done as last one was done in 2012.  Spoke with GI and patient needs to have mammogram within 6 months of MRI. Order placed and left VMM for patient to call and schedule mammogram and then MRI would be scheduled 2-3 days later.

## 2015-08-16 ENCOUNTER — Other Ambulatory Visit: Payer: BLUE CROSS/BLUE SHIELD

## 2015-10-09 ENCOUNTER — Other Ambulatory Visit: Payer: Self-pay

## 2015-10-09 DIAGNOSIS — Z1231 Encounter for screening mammogram for malignant neoplasm of breast: Secondary | ICD-10-CM

## 2015-11-20 ENCOUNTER — Ambulatory Visit
Admission: RE | Admit: 2015-11-20 | Discharge: 2015-11-20 | Disposition: A | Discharge status: Home / Self Care | Payer: BLUE CROSS/BLUE SHIELD | Source: Ambulatory Visit

## 2015-11-20 DIAGNOSIS — Z1231 Encounter for screening mammogram for malignant neoplasm of breast: Secondary | ICD-10-CM

## 2015-11-23 ENCOUNTER — Other Ambulatory Visit: Payer: Self-pay | Admitting: Family Medicine

## 2015-11-23 DIAGNOSIS — R928 Other abnormal and inconclusive findings on diagnostic imaging of breast: Secondary | ICD-10-CM

## 2015-11-28 ENCOUNTER — Telehealth: Payer: Self-pay | Admitting: Hematology and Oncology

## 2015-11-28 ENCOUNTER — Ambulatory Visit
Admission: RE | Admit: 2015-11-28 | Discharge: 2015-11-28 | Disposition: A | Discharge status: Home / Self Care | Payer: BLUE CROSS/BLUE SHIELD | Source: Ambulatory Visit | Attending: Family Medicine | Admitting: Family Medicine

## 2015-11-28 DIAGNOSIS — R928 Other abnormal and inconclusive findings on diagnostic imaging of breast: Secondary | ICD-10-CM

## 2015-11-28 NOTE — Telephone Encounter (Signed)
Patient called to get an earlier appointment then May. Patient needed to f/u from imaging that was done

## 2015-12-03 NOTE — Assessment & Plan Note (Signed)
High-risk for developing breast cancer due to her family history and 5 this evaluation breast cancer risk estimated at 20-25%. Patient has had BRCA1 and 2 testing performed which was negative for both. Could not tolerate Evista currently on surveillance.  Breast Cancer Surveillance: 1. Breast exam 12/04/15: Normal 2. Mammogram 11/22/15 No abnormalities. Breast Density Category D. dense fibroglandular tissue throughout the central aspect of the right breast. No mass, distortion, or acoustic shadowing is demonstrated with ultrasound.  Return to clinic in 1 year

## 2015-12-04 ENCOUNTER — Ambulatory Visit (HOSPITAL_BASED_OUTPATIENT_CLINIC_OR_DEPARTMENT_OTHER): Payer: BLUE CROSS/BLUE SHIELD | Admitting: Hematology and Oncology

## 2015-12-04 ENCOUNTER — Encounter: Payer: Self-pay | Admitting: Hematology and Oncology

## 2015-12-04 VITALS — BP 147/95 | HR 88 | Temp 97.7°F | Resp 17 | Ht 65.0 in | Wt 160.3 lb

## 2015-12-04 DIAGNOSIS — N63 Unspecified lump in breast: Secondary | ICD-10-CM

## 2015-12-04 DIAGNOSIS — Z803 Family history of malignant neoplasm of breast: Secondary | ICD-10-CM | POA: Diagnosis not present

## 2015-12-04 DIAGNOSIS — Z1239 Encounter for other screening for malignant neoplasm of breast: Secondary | ICD-10-CM

## 2015-12-04 MED ORDER — LORAZEPAM 1 MG PO TABS: 2.0000 mg | ORAL_TABLET | Freq: Once | ORAL | Status: DC

## 2015-12-04 MED ORDER — LOSARTAN POTASSIUM 100 MG PO TABS: 100.0000 mg | ORAL_TABLET | Freq: Every day | ORAL | Status: AC

## 2015-12-04 NOTE — Progress Notes (Signed)
Unable to get in to exam room prior to MD.  No assessment performed.  

## 2015-12-04 NOTE — Progress Notes (Signed)
Patient Care Team: Elliot Cousin, MD as PCP - General (Family Medicine)  DIAGNOSIS: Follow-up of high risk of breast cancer  CHIEF COMPLIANT: Recent mammogram and ultrasound follow-up  INTERVAL HISTORY: Ashlee Robbins is a 55 year old with above-mentioned history of family with breast cancer who is here with the high risk clinic today. Mammogram done in 11/20/2015 initially suggested a possible mass in the right breast that was further evaluated by ultrasound and was felt to be benign. She has extremely dense breasts with a Degree of D. Previously I recommended breast MRIs but she did not undergo these testing primarily because her husband who is a chiropractor felt that she should not be exposed to extrinsic chemicals. 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  REVIEW OF SYSTEMS:   Constitutional: Denies fevers, chills or abnormal weight loss Eyes: Denies blurriness of vision Ears, nose, mouth, throat, and face: Denies mucositis or sore throat Respiratory: Denies cough, dyspnea or wheezes Cardiovascular: Denies palpitation, chest discomfort Gastrointestinal:  Denies nausea, heartburn or change in bowel habits Skin: Denies abnormal skin rashes Lymphatics: Denies new lymphadenopathy or easy bruising Neurological:Denies numbness, tingling or new weaknesses Behavioral/Psych: Mood is stable, no new changes  Extremities: No lower extremity edema Breast:  denies any pain or lumps or nodules in either breasts All other systems were reviewed with the patient and are negative.  I have reviewed the past medical history, past surgical history, social history and family history with the patient and they are unchanged from previous note.  ALLERGIES:  has No Known Allergies.  MEDICATIONS:  Current Outpatient Prescriptions  Medication Sig Dispense Refill  . amLODipine (NORVASC) 5 MG tablet     . diazepam (VALIUM) 5 MG  tablet     . lisinopril-hydrochlorothiazide (PRINZIDE,ZESTORETIC) 10-12.5 MG per tablet Take by mouth.    Marland Kitchen MAGNESIUM-OXIDE 400 (241.3 MG) MG tablet   3  . Vitamin D, Ergocalciferol, (DRISDOL) 50000 UNITS CAPS capsule      No current facility-administered medications for this visit.    PHYSICAL EXAMINATION: ECOG PERFORMANCE STATUS: 1 - Symptomatic but completely ambulatory  Filed Vitals:   12/04/15 1458  BP: 147/95  Pulse: 88  Temp: 97.7 F (36.5 C)  Resp: 17   Filed Weights   12/04/15 1458  Weight: 160 lb 4.8 oz (72.712 kg)    GENERAL:alert, no distress and comfortable SKIN: skin color, texture, turgor are normal, no rashes or significant lesions EYES: normal, Conjunctiva are pink and non-injected, sclera clear OROPHARYNX:no exudate, no erythema and lips, buccal mucosa, and tongue normal  NECK: supple, thyroid normal size, non-tender, without nodularity LYMPH:  no palpable lymphadenopathy in the cervical, axillary or inguinal LUNGS: clear to auscultation and percussion with normal breathing effort HEART: regular rate & rhythm and no murmurs and no lower extremity edema ABDOMEN:abdomen soft, non-tender and normal bowel sounds MUSCULOSKELETAL:no cyanosis of digits and no clubbing  NEURO: alert & oriented x 3 with fluent speech, no focal motor/sensory deficits EXTREMITIES: No lower extremity edema  LABORATORY DATA:  I have reviewed the data as listed   Chemistry      Component Value Date/Time   NA 144 12/09/2012 1230   NA 140 11/26/2010 1339   K 4.0 12/09/2012 1230   K 4.2 11/26/2010 1339   CL 107 12/09/2012 1230   CL 101 11/26/2010 1339   CO2 28 12/09/2012 1230   CO2 26 11/26/2010 1339   BUN 21.9 12/09/2012 1230  BUN 16 11/26/2010 1339   CREATININE 1.0 12/09/2012 1230   CREATININE 0.91 11/26/2010 1339      Component Value Date/Time   CALCIUM 9.3 12/09/2012 1230   CALCIUM 10.1 11/26/2010 1339   ALKPHOS 54 12/09/2012 1230   ALKPHOS 44 11/26/2010 1339   AST 21  12/09/2012 1230   AST 26 11/26/2010 1339   ALT 19 12/09/2012 1230   ALT 18 11/26/2010 1339   BILITOT 0.59 12/09/2012 1230   BILITOT 0.6 11/26/2010 1339       Lab Results  Component Value Date   WBC 5.6 12/09/2012   HGB 14.3 12/09/2012   HCT 41.9 12/09/2012   MCV 100.0 12/09/2012   PLT 284 12/09/2012   NEUTROABS 3.5 12/09/2012   ASSESSMENT & PLAN:  Breast cancer screening, high risk patient High-risk for developing breast cancer due to her family history. Her breast cancer risk estimated at 20-25%. Patient has had BRCA1 and 2 testing performed which was negative for both. Could not tolerate Evista. Currently on surveillance.  Breast Cancer Surveillance: 1. Breast exam 12/04/15: Normal 2. Mammogram 11/22/15 No abnormalities. Breast Density Category D. dense fibroglandular tissue throughout the central aspect of the right breast. No mass, distortion, or acoustic shadowing is demonstrated with ultrasound.  I recommended that she obtain a breast MRI because of highly dense breasts with extensive family history. She finally agreed to do it and will set it up in the next 2 weeks. I instructed her to call us back so that we can discuss the results of the MRI.  Return to clinic in 1 year  No orders of the defined types were placed in this encounter.   The patient has a good understanding of the overall plan. she agrees with it. she will call with any problems that may develop before the next visit here.   Rulon Eisenmenger, MD 12/04/2015

## 2015-12-06 ENCOUNTER — Ambulatory Visit (INDEPENDENT_AMBULATORY_CARE_PROVIDER_SITE_OTHER): Payer: BLUE CROSS/BLUE SHIELD | Admitting: Sports Medicine

## 2015-12-06 ENCOUNTER — Encounter: Payer: Self-pay | Admitting: Sports Medicine

## 2015-12-06 ENCOUNTER — Telehealth: Payer: Self-pay | Admitting: Hematology and Oncology

## 2015-12-06 VITALS — BP 142/98 | HR 85 | Ht 67.0 in | Wt 155.0 lb

## 2015-12-06 DIAGNOSIS — M7581 Other shoulder lesions, right shoulder: Secondary | ICD-10-CM | POA: Diagnosis not present

## 2015-12-06 NOTE — Telephone Encounter (Signed)
Spoke with patient re f/u 12/02/16. Patient already on schedule for mri - confirmed with patient.

## 2015-12-06 NOTE — Assessment & Plan Note (Signed)
She has some microcalcifications in the supraspinatous These do appear to impinge  We will try to treat these at present with scapular stabilization exercises Modification of strokes Topical medicine as needed Consider trying Pilates  We may decide to use the nitroglycerin protocol if pain persists Recheck in 6-8 weeks

## 2015-12-06 NOTE — Progress Notes (Signed)
Patient ID: Ashlee Robbins, female   DOB: 01-07-1961, 55 y.o.   MRN: [id number]  Patient is an avid tennis player  CC: She has been having right shoulder pain  Patient had seen me in the past for some rotator cuff tendinitis of the right shoulder This seems related to tennis Serving tended to make it worse We gave her an injection at that time and this helped for several months  For the past 18 months she's had periodic recurrences of shoulder pain She had been receiving steroid injections from an orthopedist periodically  On one occasion the arm felt like it gave out and she could not lift it For that reason she had an MRI The MRI only showed some bursitis  Comes in today because she gets this intermittent pain She does not get night pain She has taken some time off tennis but like to start again  P/f/s HX She is under high risk screening for breast cancer Nonsmoker Past history at sports medicine of knee problems and piriformis problems  Review of systems No numbness or tingling into the arm No rest pain No swelling of other joints No current neck pain  Physical examination No acute distress, muscular female Oriented x3 BP 142/98 mmHg  Pulse 85  Ht  (1.702 m)  Wt 155 lb (70.308 kg)  BMI 24.27 kg/m2  Shoulder: Inspection reveals no abnormalities, atrophy or asymmetry. Palpation is normal with no tenderness over AC joint or bicipital groove. ROM is full in all planes. Rotator cuff strength normal throughout.  She shows signs of impingement with + Neer and Hawkin's tests, empty can. Speeds and Yergason's tests normal. No labral pathology noted with negative Obrien's, negative clunk and good stability. Normal scapular function observed. No painful arc and no drop arm sign. No apprehension sign  Ultrasound Biceps tendon is intact with no fluid A.c. Joint shows mild arthritis Infraspinatus and teres minor are normal Supraspinatous shows some calcification  distally but is otherwise normal Impingement testing shows some mild impingement of the supraspinatous with elevation of the arm

## 2015-12-06 NOTE — Patient Instructions (Signed)
Home exercises to keep you from pinching supraspinatus tendon under the shoulder bone  This is called shoulder impingment   External flys  Upright rows  Rows at waist level  Tennis specific dumbell exercises  Forehand  Backhand  And serve  Do all exercise 3 sets with 3 lb weight 15 repeats  If you do 5lbs only do 2 sets with 10 repeats

## 2015-12-18 ENCOUNTER — Other Ambulatory Visit: Payer: BLUE CROSS/BLUE SHIELD

## 2015-12-19 DIAGNOSIS — E538 Deficiency of other specified B group vitamins: Secondary | ICD-10-CM | POA: Insufficient documentation

## 2015-12-19 DIAGNOSIS — I10 Essential (primary) hypertension: Secondary | ICD-10-CM | POA: Insufficient documentation

## 2015-12-25 ENCOUNTER — Other Ambulatory Visit: Payer: Self-pay | Admitting: *Deleted

## 2015-12-25 DIAGNOSIS — M7581 Other shoulder lesions, right shoulder: Secondary | ICD-10-CM

## 2015-12-28 ENCOUNTER — Ambulatory Visit
Admission: RE | Admit: 2015-12-28 | Discharge: 2015-12-28 | Disposition: A | Discharge status: Home / Self Care | Payer: BLUE CROSS/BLUE SHIELD | Source: Ambulatory Visit | Attending: Hematology and Oncology | Admitting: Hematology and Oncology

## 2015-12-28 ENCOUNTER — Other Ambulatory Visit: Payer: Self-pay | Admitting: Hematology and Oncology

## 2015-12-28 DIAGNOSIS — Z1239 Encounter for other screening for malignant neoplasm of breast: Secondary | ICD-10-CM

## 2015-12-28 MED ORDER — GADOBENATE DIMEGLUMINE 529 MG/ML IV SOLN
14.0000 mL | Freq: Once | INTRAVENOUS | Status: AC | PRN
  Administered 2015-12-28: 14 mL via INTRAVENOUS

## 2016-01-01 ENCOUNTER — Ambulatory Visit
Admission: RE | Admit: 2016-01-01 | Discharge: 2016-01-01 | Disposition: A | Discharge status: Home / Self Care | Payer: BLUE CROSS/BLUE SHIELD | Source: Ambulatory Visit | Attending: Hematology and Oncology | Admitting: Hematology and Oncology

## 2016-01-01 ENCOUNTER — Other Ambulatory Visit: Payer: Self-pay

## 2016-01-01 DIAGNOSIS — Z1239 Encounter for other screening for malignant neoplasm of breast: Secondary | ICD-10-CM

## 2016-02-06 ENCOUNTER — Ambulatory Visit (INDEPENDENT_AMBULATORY_CARE_PROVIDER_SITE_OTHER): Payer: BLUE CROSS/BLUE SHIELD | Admitting: Sports Medicine

## 2016-02-06 ENCOUNTER — Encounter: Payer: Self-pay | Admitting: Sports Medicine

## 2016-02-06 VITALS — BP 133/89 | Ht 67.0 in | Wt 155.0 lb

## 2016-02-06 DIAGNOSIS — M25561 Pain in right knee: Secondary | ICD-10-CM

## 2016-02-06 DIAGNOSIS — M7581 Other shoulder lesions, right shoulder: Secondary | ICD-10-CM

## 2016-02-06 NOTE — Assessment & Plan Note (Signed)
Much improved  Will cont OP pilates

## 2016-02-06 NOTE — Assessment & Plan Note (Signed)
I still think she has good function  Would recommend aggressive home PT program with more biking

## 2016-02-06 NOTE — Progress Notes (Signed)
Patient ID: Ashlee JohnsLisa Paule, female   DOB: 03/29/1961, 55 y.o.   MRN: [id number] CC: Follow up on right shoulder pain  HPI: 55 yo woman here to follow up on right shoulder pain, last evaluated two months ago. She has been performing PT Pilates and now reports normal, pain-free use of right shoulder. She continues to be actively involved with pilates and has started golf again, though not yet back to tennis. Rarely her shoulder will "catch" but it is brief, not painful or limiting.   She has questions about need for right knee replacement. Advised by surgeon she needs one, curious about other options. Denies limiting knee pain, still has good range of motion. Considering stem cell injections to RT knee as well.  ROS: No other complaints today, feels well. Able to bend knee with no pain No nightpain  PMH: Shoulder pain, hip pain, right knee pain  PE: BP 133/89 mmHg  Ht 5\' 7"  (1.702 m)  Wt 155 lb (70.308 kg)  BMI 24.27 kg/m2  GEN: WDWN woman, sits comfortably in NAD CV/PULM: Nonlabored breathing, peripheral circulation grossly intact. MSK: Mild horizontal motion of right knee with normal gait. Wall sit, Thessaly and deep knee flexion do not cause significant pain. General exam is not remarkable today  A/P:  1. Right rotator cuff pain, improved. Continue activity and pilates as tolerated. No follow up required unless new concerns arise.  2. Right knee osteoarthritis, mild symptoms. No indication for TKA at this time in my opinion and I would push PT. . Encouraged continued activity, limit deep flexion. Encouraged 30-45 mintues of easy stationary bike every other today to promote quad strength. Discussed risks, benefits and research behind fat stem cell treatments, Simvisc and cortisone shots as well. Do not recommend any at this time.

## 2016-02-26 ENCOUNTER — Ambulatory Visit: Payer: BLUE CROSS/BLUE SHIELD | Admitting: Hematology and Oncology

## 2016-06-17 DIAGNOSIS — F5101 Primary insomnia: Secondary | ICD-10-CM | POA: Insufficient documentation

## 2016-06-17 DIAGNOSIS — E559 Vitamin D deficiency, unspecified: Secondary | ICD-10-CM | POA: Insufficient documentation

## 2016-06-17 DIAGNOSIS — F419 Anxiety disorder, unspecified: Secondary | ICD-10-CM | POA: Insufficient documentation

## 2016-10-22 DIAGNOSIS — Z96651 Presence of right artificial knee joint: Secondary | ICD-10-CM | POA: Insufficient documentation

## 2016-11-01 ENCOUNTER — Other Ambulatory Visit: Payer: Self-pay | Admitting: Family Medicine

## 2016-11-01 DIAGNOSIS — Z1231 Encounter for screening mammogram for malignant neoplasm of breast: Secondary | ICD-10-CM

## 2016-11-28 ENCOUNTER — Telehealth: Payer: Self-pay | Admitting: Hematology and Oncology

## 2016-11-28 NOTE — Telephone Encounter (Signed)
Pt called to r/s 2/12 appt to May . Gave pt new appt date/time 02/24/17 at 3 pm

## 2016-11-29 ENCOUNTER — Ambulatory Visit
Admission: RE | Admit: 2016-11-29 | Discharge: 2016-11-29 | Disposition: A | Discharge status: Home / Self Care | Payer: Self-pay | Source: Ambulatory Visit | Attending: Family Medicine | Admitting: Family Medicine

## 2016-11-29 DIAGNOSIS — Z1231 Encounter for screening mammogram for malignant neoplasm of breast: Secondary | ICD-10-CM

## 2016-12-02 ENCOUNTER — Ambulatory Visit: Payer: BLUE CROSS/BLUE SHIELD | Admitting: Hematology and Oncology

## 2016-12-03 ENCOUNTER — Other Ambulatory Visit: Payer: Self-pay | Admitting: Family Medicine

## 2016-12-03 DIAGNOSIS — R928 Other abnormal and inconclusive findings on diagnostic imaging of breast: Secondary | ICD-10-CM

## 2016-12-06 ENCOUNTER — Other Ambulatory Visit: Payer: Self-pay | Admitting: Family Medicine

## 2016-12-06 ENCOUNTER — Ambulatory Visit
Admission: RE | Admit: 2016-12-06 | Discharge: 2016-12-06 | Disposition: A | Discharge status: Home / Self Care | Payer: No Typology Code available for payment source | Source: Ambulatory Visit | Attending: Family Medicine | Admitting: Family Medicine

## 2016-12-06 DIAGNOSIS — R928 Other abnormal and inconclusive findings on diagnostic imaging of breast: Secondary | ICD-10-CM

## 2017-02-24 ENCOUNTER — Ambulatory Visit: Payer: BLUE CROSS/BLUE SHIELD | Admitting: Hematology and Oncology

## 2017-03-30 IMAGING — MG MM SCREENING BREAST TOMO BILATERAL
6 of 9 series · 6 of 25 positions shown · non-contrast
Comparison: Previous exam(s).

CLINICAL DATA: Screening.

EXAM:
DIGITAL SCREENING BILATERAL MAMMOGRAM WITH 3D TOMO WITH CAD

[R MLO (1 of 2)]
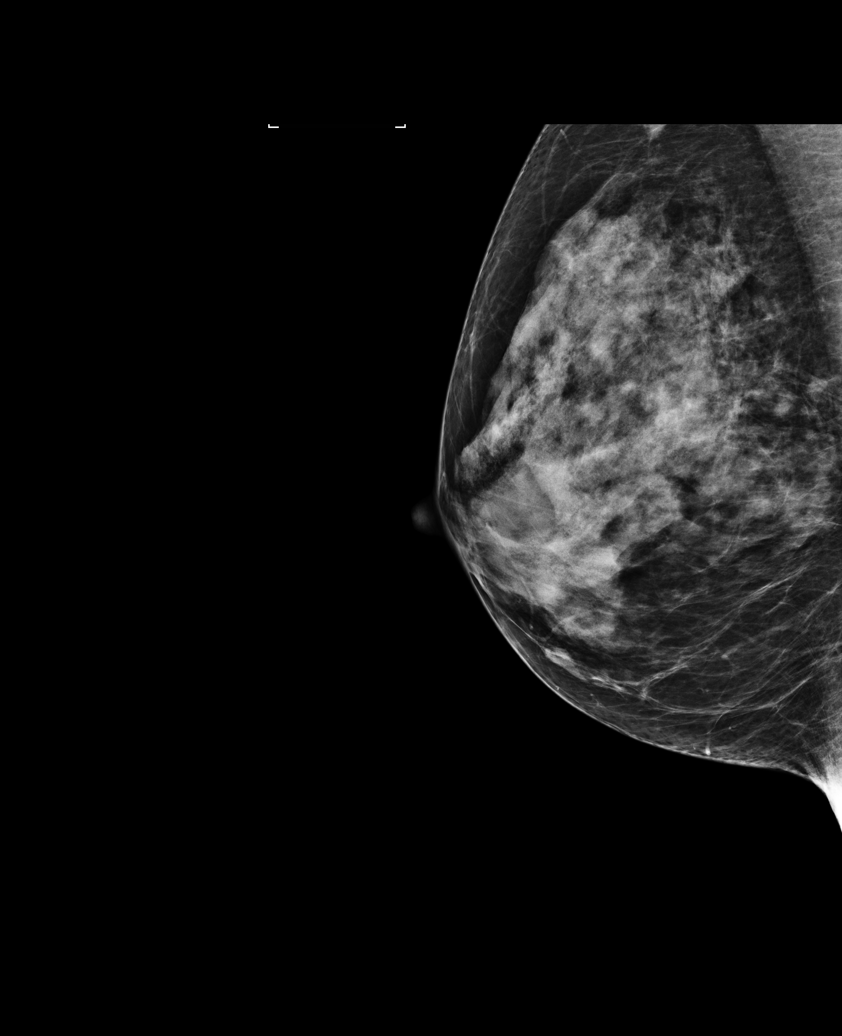

[L CC]
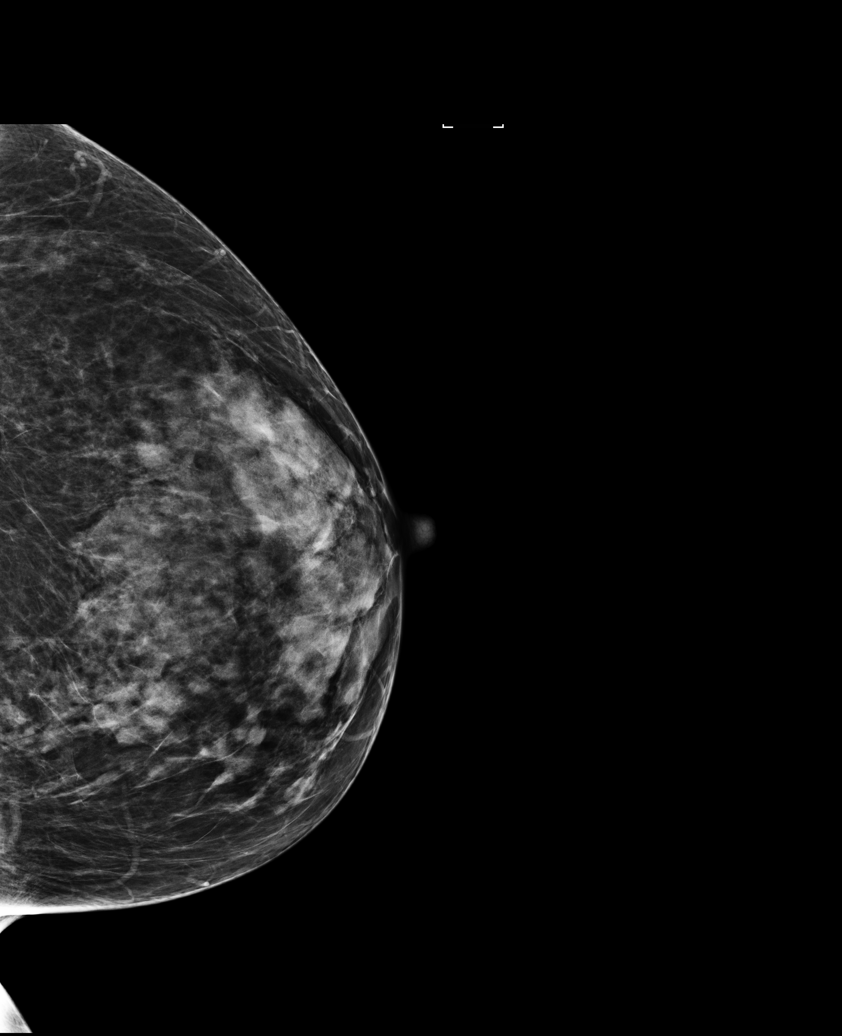

[R CC]
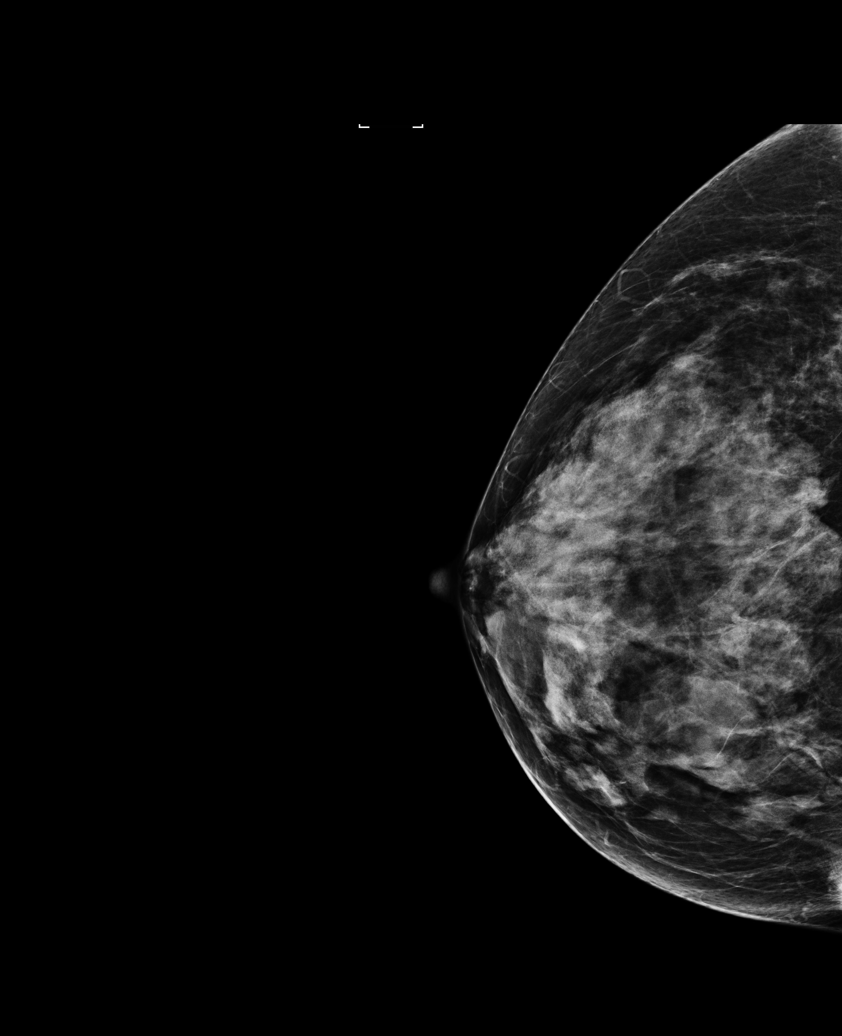

[L MLO]
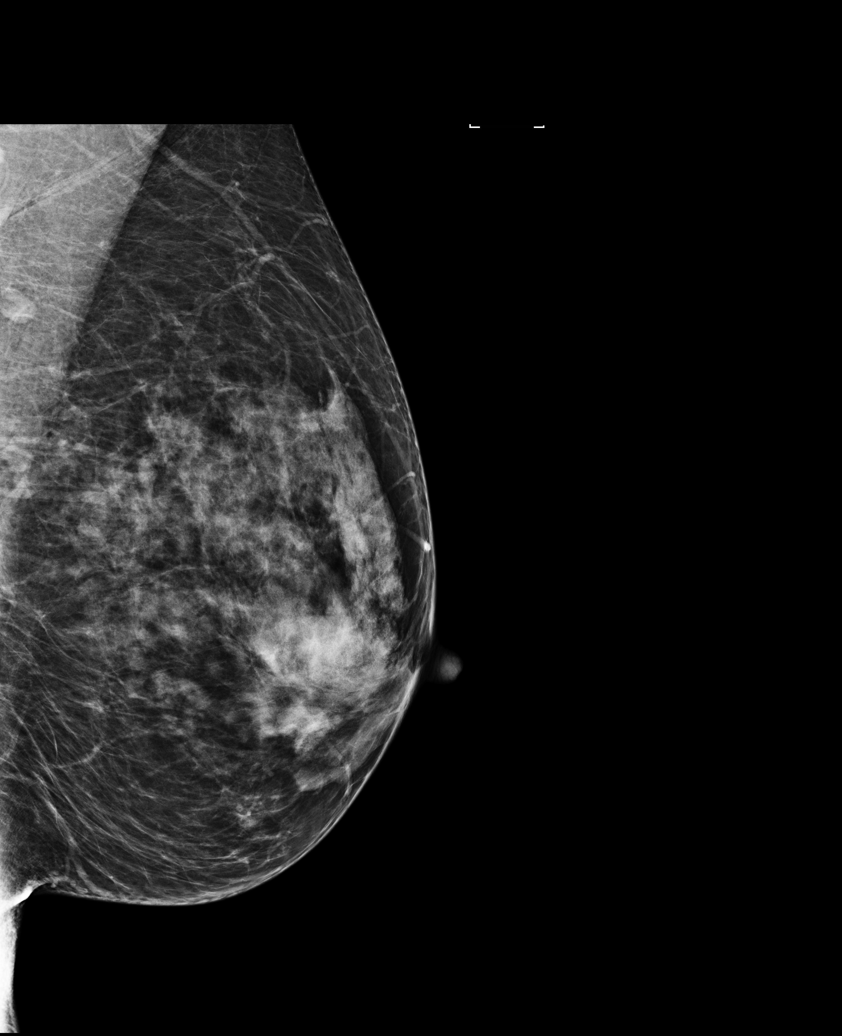

[R MLO (2 of 2)]
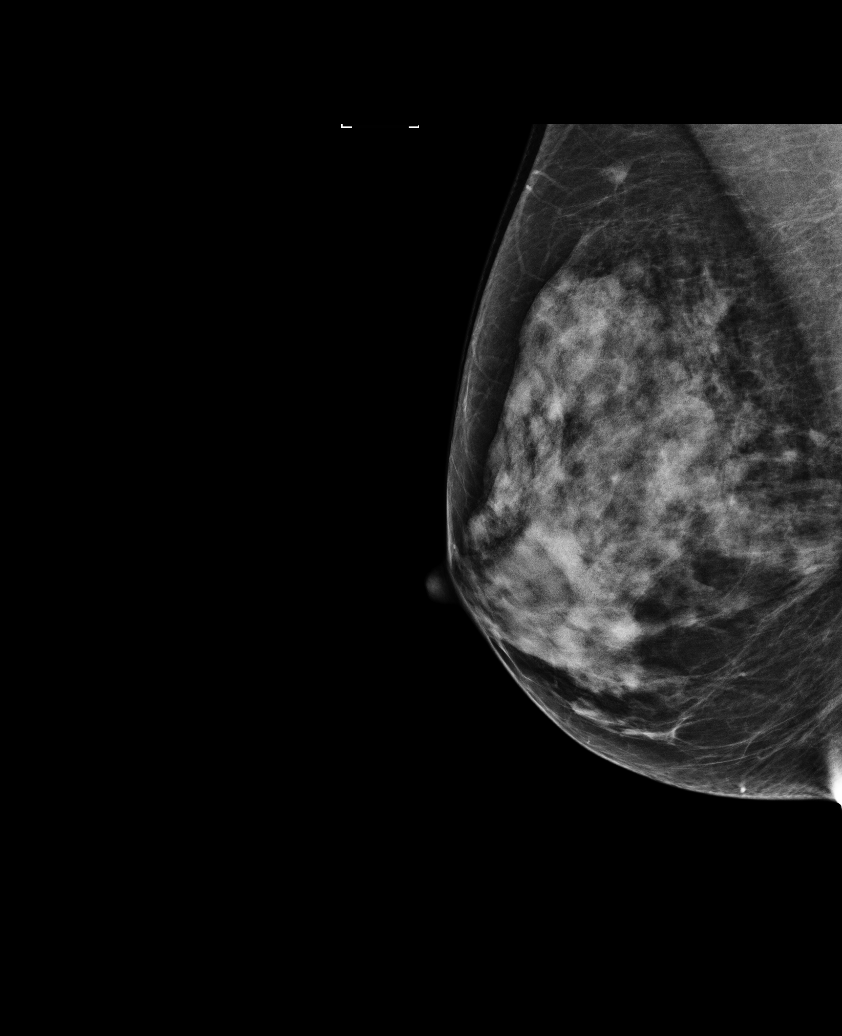

[L CC tomo · tomo slice 37/72.0]
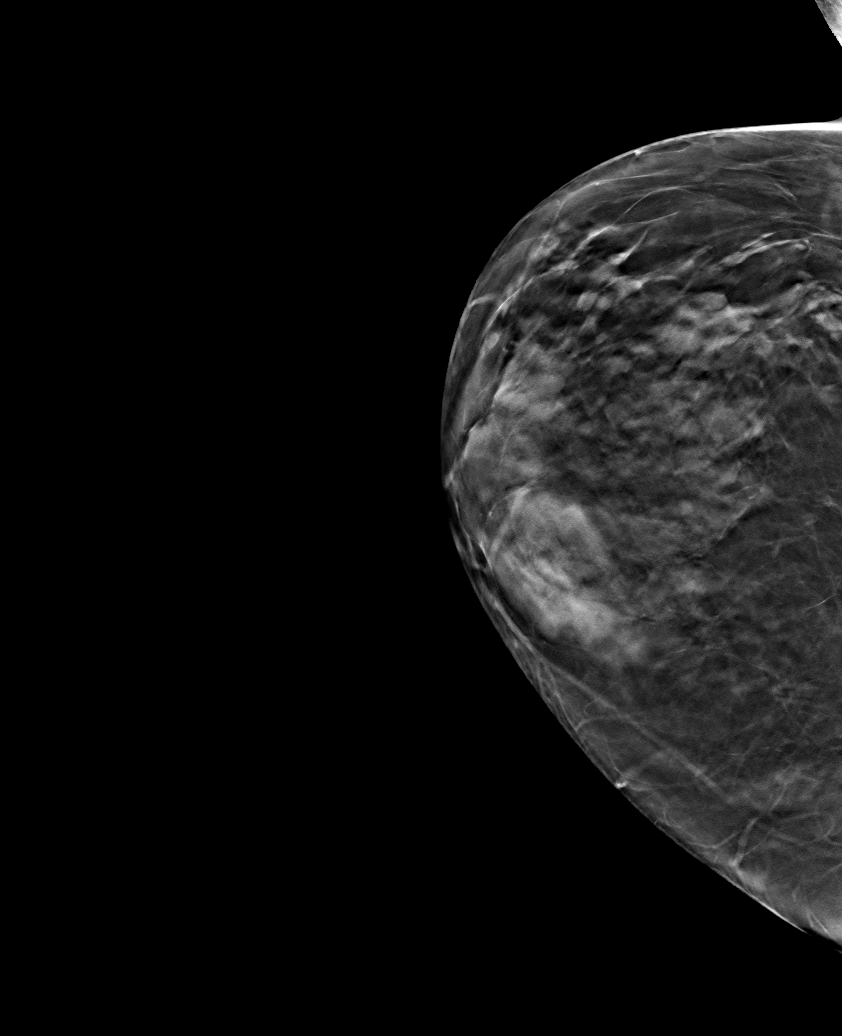

[6 of 25 positions shown; findings below may reference images not displayed]

ACR Breast Density Category d: The breast tissue is extremely dense,
which lowers the sensitivity of mammography.
FINDINGS: In the right breast, a possible mass warrants further evaluation. In
the left breast, no findings suspicious for malignancy. Images were
processed with CAD.
IMPRESSION: Further evaluation is suggested for possible mass in the right
breast.

RECOMMENDATION:
Diagnostic mammogram and possibly ultrasound of the right breast.
(Code:07-1-OOV)

The patient will be contacted regarding the findings, and additional
imaging will be scheduled.

BI-RADS CATEGORY  0: Incomplete. Need additional imaging evaluation
and/or prior mammograms for comparison.

## 2017-05-26 ENCOUNTER — Ambulatory Visit: Payer: No Typology Code available for payment source | Admitting: Hematology and Oncology

## 2017-05-26 ENCOUNTER — Telehealth: Payer: Self-pay

## 2017-05-26 NOTE — Telephone Encounter (Signed)
Pt called with car trouble and not able to make appt. Could not get through to scheduler.  LOS sent to scheduler.

## 2017-05-27 ENCOUNTER — Telehealth: Payer: Self-pay | Admitting: Hematology and Oncology

## 2017-05-27 NOTE — Telephone Encounter (Signed)
sw pt to confirm 8/27 appt per sch msg. Pt stated she has a huge car bill and has to pay upfront for medical appts through her insurance. Ptwant appt scheduled later in the year. Pt to call back to sch appt when can

## 2017-06-16 ENCOUNTER — Ambulatory Visit: Payer: No Typology Code available for payment source | Admitting: Hematology and Oncology

## 2017-08-01 DIAGNOSIS — S01319A Laceration without foreign body of unspecified ear, initial encounter: Secondary | ICD-10-CM | POA: Insufficient documentation

## 2017-09-10 ENCOUNTER — Other Ambulatory Visit: Payer: Self-pay | Admitting: Family Medicine

## 2017-09-10 DIAGNOSIS — N6310 Unspecified lump in the right breast, unspecified quadrant: Secondary | ICD-10-CM

## 2017-09-17 ENCOUNTER — Ambulatory Visit
Admission: RE | Admit: 2017-09-17 | Discharge: 2017-09-17 | Disposition: A | Discharge status: Home / Self Care | Payer: Self-pay | Source: Ambulatory Visit | Attending: Family Medicine | Admitting: Family Medicine

## 2017-09-17 ENCOUNTER — Other Ambulatory Visit: Payer: Self-pay | Admitting: Family Medicine

## 2017-09-17 ENCOUNTER — Ambulatory Visit
Admission: RE | Admit: 2017-09-17 | Discharge: 2017-09-17 | Disposition: A | Discharge status: Home / Self Care | Payer: No Typology Code available for payment source | Source: Ambulatory Visit | Attending: Family Medicine | Admitting: Family Medicine

## 2017-09-17 DIAGNOSIS — N6310 Unspecified lump in the right breast, unspecified quadrant: Secondary | ICD-10-CM

## 2017-09-17 DIAGNOSIS — M7989 Other specified soft tissue disorders: Secondary | ICD-10-CM

## 2017-12-05 ENCOUNTER — Ambulatory Visit
Admission: RE | Admit: 2017-12-05 | Discharge: 2017-12-05 | Disposition: A | Discharge status: Home / Self Care | Payer: No Typology Code available for payment source | Source: Ambulatory Visit | Attending: Family Medicine | Admitting: Family Medicine

## 2017-12-05 DIAGNOSIS — M7989 Other specified soft tissue disorders: Secondary | ICD-10-CM

## 2018-06-01 DIAGNOSIS — M79641 Pain in right hand: Secondary | ICD-10-CM | POA: Insufficient documentation

## 2018-09-11 ENCOUNTER — Emergency Department (HOSPITAL_BASED_OUTPATIENT_CLINIC_OR_DEPARTMENT_OTHER)
Admission: EM | Admit: 2018-09-11 | Discharge: 2018-09-12 | Disposition: A | Discharge status: Home / Self Care | Payer: No Typology Code available for payment source | Attending: Emergency Medicine | Admitting: Emergency Medicine

## 2018-09-11 ENCOUNTER — Other Ambulatory Visit: Payer: Self-pay

## 2018-09-11 ENCOUNTER — Encounter (HOSPITAL_BASED_OUTPATIENT_CLINIC_OR_DEPARTMENT_OTHER): Payer: Self-pay | Admitting: *Deleted

## 2018-09-11 DIAGNOSIS — I10 Essential (primary) hypertension: Secondary | ICD-10-CM | POA: Diagnosis not present

## 2018-09-11 DIAGNOSIS — Z23 Encounter for immunization: Secondary | ICD-10-CM | POA: Diagnosis not present

## 2018-09-11 DIAGNOSIS — W540XXA Bitten by dog, initial encounter: Secondary | ICD-10-CM | POA: Insufficient documentation

## 2018-09-11 DIAGNOSIS — Y929 Unspecified place or not applicable: Secondary | ICD-10-CM | POA: Insufficient documentation

## 2018-09-11 DIAGNOSIS — Z96659 Presence of unspecified artificial knee joint: Secondary | ICD-10-CM | POA: Diagnosis not present

## 2018-09-11 DIAGNOSIS — Z79899 Other long term (current) drug therapy: Secondary | ICD-10-CM | POA: Diagnosis not present

## 2018-09-11 DIAGNOSIS — S0185XA Open bite of other part of head, initial encounter: Secondary | ICD-10-CM

## 2018-09-11 DIAGNOSIS — S0181XA Laceration without foreign body of other part of head, initial encounter: Secondary | ICD-10-CM | POA: Diagnosis not present

## 2018-09-11 DIAGNOSIS — S01511A Laceration without foreign body of lip, initial encounter: Secondary | ICD-10-CM | POA: Diagnosis not present

## 2018-09-11 DIAGNOSIS — Y939 Activity, unspecified: Secondary | ICD-10-CM | POA: Insufficient documentation

## 2018-09-11 DIAGNOSIS — S1191XA Laceration without foreign body of unspecified part of neck, initial encounter: Secondary | ICD-10-CM | POA: Diagnosis not present

## 2018-09-11 DIAGNOSIS — Y999 Unspecified external cause status: Secondary | ICD-10-CM | POA: Insufficient documentation

## 2018-09-11 NOTE — ED Notes (Signed)
Family at bedside. 

## 2018-09-11 NOTE — ED Triage Notes (Signed)
Pt arrived with dog bite to the left face that about happened about 1 hour ago. Pt has puncture wound to the chin, and lacerations to the left lip and just lateral to the left lip. Bleeding controled. Unknown tetanus. The dog was a friends dog who is present in triage, states the dogs vaccinations are up to date.

## 2018-09-12 MED ORDER — HYDROCODONE-ACETAMINOPHEN 5-325 MG PO TABS: 1.0000 | ORAL_TABLET | ORAL | 0 refills | Status: DC | PRN

## 2018-09-12 MED ORDER — AMOXICILLIN-POT CLAVULANATE 875-125 MG PO TABS: 1.0000 | ORAL_TABLET | Freq: Two times a day (BID) | ORAL | 0 refills | Status: AC

## 2018-09-12 MED ORDER — SODIUM CHLORIDE 0.9 % IV SOLN
3.0000 g | Freq: Once | INTRAVENOUS | Status: AC
  Administered 2018-09-12: 3 g via INTRAVENOUS
  Filled 2018-09-12: qty 3

## 2018-09-12 MED ORDER — LIDOCAINE-EPINEPHRINE (PF) 2 %-1:200000 IJ SOLN
20.0000 mL | Freq: Once | INTRAMUSCULAR | Status: AC
  Administered 2018-09-12: 20 mL
  Filled 2018-09-12: qty 20

## 2018-09-12 MED ORDER — TETANUS-DIPHTH-ACELL PERTUSSIS 5-2.5-18.5 LF-MCG/0.5 IM SUSP
0.5000 mL | Freq: Once | INTRAMUSCULAR | Status: AC
  Administered 2018-09-12: 0.5 mL via INTRAMUSCULAR
  Filled 2018-09-12: qty 0.5

## 2018-09-12 NOTE — ED Provider Notes (Signed)
[facility] Provider Note: Lowella Dell, MD, FACEP  CSN: [id number] MRN: [id number] ARRIVAL: 09/11/18 at 2306 ROOM: MH09/MH09   CHIEF COMPLAINT  Animal Bite   HISTORY OF PRESENT ILLNESS  09/12/18 1:15 AM Danilynn Jemison is a 57 y.o. female who was bit by a neighbor's dog in the face and chin about 3 hours ago.  She has a complex laceration to the left side of her mouth and a laceration under her chin.  There is mild associated pain.  Bleeding is controlled.  She is not sure of her tetanus status.  The dog is healthy and up-to-date on its vaccinations.   Past Medical History:  Diagnosis Date  . Breast cancer screening, high risk patient 12/09/2012  . Hx of cervical cancer   . Hypertension     Past Surgical History:  Procedure Laterality Date  . REPLACEMENT TOTAL KNEE    . TUBAL LIGATION      Family History  Problem Relation Age of Onset  . Cancer Mother        THYROID  . Breast cancer Mother 47  . Diabetes Father   . Cancer Sister        THYROID  . Breast cancer Sister 35  . Breast cancer Brother        THYROID  . Hypertension Brother   . Breast cancer Maternal Grandmother   . Breast cancer Maternal Aunt     Social History   Tobacco Use  . Smoking status: Never Smoker  Substance Use Topics  . Alcohol use: Yes  . Drug use: No    Prior to Admission medications   Medication Sig Start Date End Date Taking? Authorizing Provider  amLODipine (NORVASC) 5 MG tablet  07/26/13   [provider]  baclofen (LIORESAL) 20 MG tablet  12/20/15   [provider]  cyanocobalamin (,VITAMIN B-12,) 1000 MCG/ML injection ADM 1 ML IM 1 TIME A MONTH 11/12/15   [provider]  fluticasone (FLONASE) 50 MCG/ACT nasal spray Place into the nose.    [provider]  LORazepam (ATIVAN) 1 MG tablet Take 2 tablets (2 mg total) by mouth once. 12/04/15   Serena Croissant, MD  losartan (COZAAR) 100 MG tablet Take 1 tablet (100 mg total) by mouth  daily. 12/04/15   Serena Croissant, MD  valACYclovir (VALTREX) 1000 MG tablet Take by mouth.    [provider]  Vitamin D, Ergocalciferol, (DRISDOL) 50000 units CAPS capsule TK 1 C PO WEEKLY 11/13/15   [provider]    Allergies Patient has no known allergies.   REVIEW OF SYSTEMS  Negative except as noted here or in the History of Present Illness.   PHYSICAL EXAMINATION  Initial Vital Signs Blood pressure (!) 167/107, pulse 91, temperature 97.8 F (36.6 C), temperature source Oral, resp. rate 18, height 5\' 6"  (1.676 m), weight 70.3 kg, SpO2 100 %.  Examination General: Well-developed, well-nourished female in no acute distress; appearance consistent with age of record HENT: normocephalic; complex laceration to the left of the mouth; laceration below the mandible:    Eyes: pupils equal, round and reactive to light; extraocular muscles intact Neck: supple Heart: regular rate and rhythm Lungs: clear to auscultation bilaterally Abdomen: soft; nondistended; nontender; bowel sounds present Extremities: No deformity; full range of motion; pulses normal Neurologic: Awake, alert and oriented; motor function intact in all extremities and symmetric; no facial droop Skin: Warm and dry Psychiatric: Normal mood and affect   RESULTS  Summary of  this visit's results, reviewed by myself:   EKG Interpretation  Date/Time:    Ventricular Rate:    PR Interval:    QRS Duration:   QT Interval:    QTC Calculation:   R Axis:     Text Interpretation:        Laboratory Studies: No results found for this or any previous visit (from the past 24 hour(s)). Imaging Studies: No results found.  ED COURSE and MDM  Nursing notes and initial vitals signs, including pulse oximetry, reviewed.  Vitals:   09/11/18 2331 09/11/18 2333  BP:  (!) 167/107  Pulse:  91  Resp:  18  Temp:  97.8 F (36.6 C)  TempSrc:  Oral  SpO2:  100%  Weight: 70.3 kg   Height: 5\' 6"  (1.676 m)     1:42 AM Patient given Unasyn 3 g for dog bite.  Tetanus updated.  Discussed with Dr. Kenney Housemanrab of maxillofacial surgery who [facility].  Dr. Elesa MassedWard, EDP, advised as well.  PROCEDURES    ED DIAGNOSES     ICD-10-CM   1. Dog bite of face, initial encounter S01.85XA    W54.0XXA   2. Facial laceration, initial encounter S01.81XA   3. Chin laceration, initial encounter N82.95AOS01.81XA        Paula LibraMolpus, Kitty Cadavid, MD 09/12/18 [phone]

## 2018-09-12 NOTE — Procedures (Signed)
Preoperative Dx: multiple facial lacerations.  Postoperative Dx: s/p laceration repairs.  Procedures: 1. Repair of complex/stellate multiple vertical lacerations to left commisure/vermilion and cheek areas - total 11cm 2. Repair of deep 3cm horizontal laceration anterior neck area.  Surgeon: Junita PushJustin L Brysun Eschmann, DMD  Procedure: The patient was anesthetized with 10cc of 2% lidocaine with 1:100,000 epi in the left cheek/anterior neck. The wounds were thoroughly cleansed with saline utilizing a shielded pressure syringe. The left commissure/vermilion/cheek lacerations with first reapproximated with multiple deep muscle and subcutaneous 4-0 vicryl sutures. The vermilion/skin was then reapproximated with multiple 5-0 prolene sutures. The cervical laceration was reapproximated with deep 4-0 vicryl sutures, then the skin was reapprox with 5-0 prolene sutures. Following repairs, the wounds were cleansed again and a thin coat of Bacitracin ointment was applied. The patient tolerated the procedure well.  Complications: None  EBL: <5cc  Disposition: 1. Plan to remove sutures in 7-10 days. 2. Please see Oral & Maxillofacial Surgery Consult note for recommendations, wound care, and follow up information.  Junita PushJustin L Mckena Chern, DMD Oral & Maxillofacial Surgery

## 2018-09-12 NOTE — ED Notes (Signed)
Called maxillofacial (Dr. Kenney Housemanrab) for consult.

## 2018-09-12 NOTE — ED Notes (Signed)
ED Provider at bedside. 

## 2018-09-12 NOTE — ED Provider Notes (Signed)
Patient transferred [facility] after being evaluated by Dr. Read DriversMolpus for dog bite injury to face. Wound repair to be done by Dr. Kenney Housemanrab who was consulted prior to leaving [facility].   On arrival, the patient appears comfortable. She denies any needs currently. She is concerned that the wound was not adequately cleaned prior to transfer.   Open lacerations to left lower lip and perioral areas have not bleeding or significant swelling. No contamination that is visible. Patient reassured that the wound looks well and the majority of deep cleaning will occur during repair.   Dr. Kenney Housemanrab notified that the patient is in the ED and he will be in to see and evaluate.   5:20 - Dr. Kenney Housemanrab repaired the facial wounds at bedside. He recommends 10 days of Augmentin, topical Bacitracin, a few pain pills and 10 day follow up in office. She is to contact the office on Monday for a scheduled time.   Patient is feeling well. No needs prior to discharge.    Elpidio AnisUpstill, Kery Haltiwanger, PA-C 09/12/18 0519    Ward, Layla MawKristen N, DO 09/12/18 [phone]

## 2018-09-12 NOTE — Consult Note (Signed)
Reason for Consult: dog bite to face Referring Physician: ED  Ashlee Robbins is an 57 y.o. female.  HPI: 57 y.o. female who was bit by a neighbor's dog in the face and chin about 3 hours ago.  She has a complex laceration to the left side of her mouth and a laceration under her chin.  There is mild associated pain.  Bleeding is controlled.  She is not sure of her tetanus status.  The dog is healthy and up-to-date on its vaccinations. She received tetanus and Unasyn IV at ED. Currently, patient c/o left facial soreness.    Past Medical History:  Diagnosis Date  . Breast cancer screening, high risk patient 12/09/2012  . Hx of cervical cancer   . Hypertension     Past Surgical History:  Procedure Laterality Date  . REPLACEMENT TOTAL KNEE    . TUBAL LIGATION      Family History  Problem Relation Age of Onset  . Cancer Mother        THYROID  . Breast cancer Mother 1758  . Diabetes Father   . Cancer Sister        THYROID  . Breast cancer Sister [id number]  . Breast cancer Brother        THYROID  . Hypertension Brother   . Breast cancer Maternal Grandmother   . Breast cancer Maternal Aunt     Social History:  reports that she has never smoked. She does not have any smokeless tobacco history on file. She reports that she drinks alcohol. She reports that she does not use drugs.  Allergies: No Known Allergies  Medications: I have reviewed the patient's current medications.  No results found for this or any previous visit (from the past 48 hour(s)).  No results found.  ROS: other than HPI, neg Blood pressure 111/78, pulse 96, temperature 97.8 F (36.6 C), temperature source Oral, resp. rate 16, height 5\' 6"  (1.676 m), weight 70.3 kg, SpO2 96 %. Physical Exam  Gen: awake/alert, nad HEENT: PERRL, EOMI; mild left facial swelling.  There are multiple stellate/complex lacerations to the left commissure and cheek area.  This does include the vermilion at the commissure area.  It does not  appear to progress intraorally.  Her occlusion is stable and reproducible.  There is another 2 cm laceration in the anterior cervical midline, just under the chin.  Assessment/Plan: 57 y/o F with multiple complex/stellate left facial and anterior cervical lacerations. No apparent bony injury. Plan for debridement and repair at bedside.  Postop Recommendations: *Augmentin 875mg  bid x 10 days. *Bacitracin to wound bid after cleaning with H2O2/water soln and q-tip applicator. *Soft diet until further notice. *Follow up in 10 days with [facility], pt to call [phone] to make appt.  Junita PushJustin L Alydia Gosser, DMD Oral & Maxillofacial Surgery 09/12/2018, 5:17 AM

## 2018-09-15 ENCOUNTER — Institutional Professional Consult (permissible substitution): Payer: No Typology Code available for payment source | Admitting: Plastic Surgery

## 2018-09-29 ENCOUNTER — Ambulatory Visit (INDEPENDENT_AMBULATORY_CARE_PROVIDER_SITE_OTHER): Payer: Managed Care, Other (non HMO) | Admitting: Plastic Surgery

## 2018-09-29 ENCOUNTER — Encounter: Payer: Self-pay | Admitting: Plastic Surgery

## 2018-09-29 VITALS — BP 130/90 | HR 82 | Ht 67.0 in | Wt 155.0 lb

## 2018-09-29 DIAGNOSIS — S0185XA Open bite of other part of head, initial encounter: Secondary | ICD-10-CM | POA: Diagnosis not present

## 2018-09-29 DIAGNOSIS — W540XXA Bitten by dog, initial encounter: Secondary | ICD-10-CM | POA: Diagnosis not present

## 2018-09-29 NOTE — Progress Notes (Signed)
Patient ID: Ashlee Robbins, female    DOB: 1961/07/09, 57 y.o.   MRN: [id number]   Chief Complaint  Patient presents with  . Skin Problem    The patient is a 57 year old white female here for evaluation of her facial scars.  She was bitten by a friend's dog that was the size of a [place] Shepherd 3 weeks ago.  She was seen in the emergency room directly after and repaired by Dr. Kenney Houseman.  There has not been any sign of infection that she reports.  The incisions are healing.  She used Neosporin for about a week and is now using Mederma. There is a 3 cm laceration on the left perioral chin area and left upper lateral lip area.  There is another horizontal laceration on the upper neck lower submental area that is slightly hypertrophied.     Review of Systems  Constitutional: Negative.   HENT: Negative.   Eyes: Negative.   Respiratory: Negative.   Gastrointestinal: Negative.   Endocrine: Negative.   Genitourinary: Negative.   Skin: Negative.   Neurological: Negative for speech difficulty.    Past Medical History:  Diagnosis Date  . Breast cancer screening, high risk patient 12/09/2012  . Hx of cervical cancer   . Hypertension     Past Surgical History:  Procedure Laterality Date  . REPLACEMENT TOTAL KNEE    . TUBAL LIGATION        Current Outpatient Medications:  .  amLODipine (NORVASC) 5 MG tablet, , Disp: , Rfl:  .  baclofen (LIORESAL) 20 MG tablet, , Disp: , Rfl: 10 .  cyanocobalamin (,VITAMIN B-12,) 1000 MCG/ML injection, ADM 1 ML IM 1 TIME A MONTH, Disp: , Rfl: 1 .  HYDROcodone-acetaminophen (NORCO/VICODIN) 5-325 MG tablet, Take 1 tablet by mouth every 4 (four) hours as needed for severe pain., Disp: 8 tablet, Rfl: 0 .  LORazepam (ATIVAN) 1 MG tablet, Take 2 tablets (2 mg total) by mouth once., Disp: 4 tablet, Rfl: 0 .  losartan (COZAAR) 100 MG tablet, Take 1 tablet (100 mg total) by mouth daily., Disp: , Rfl:  .  valACYclovir (VALTREX) 1000 MG tablet, Take by mouth., Disp: ,  Rfl:  .  Vitamin D, Ergocalciferol, (DRISDOL) 50000 units CAPS capsule, TK 1 C PO WEEKLY, Disp: , Rfl: 3 .  fluticasone (FLONASE) 50 MCG/ACT nasal spray, Place into the nose., Disp: , Rfl:    Objective:   Vitals:   09/29/18 1021  BP: 130/90  Pulse: 82  SpO2: 96%    Physical Exam  Constitutional: She is oriented to person, place, and time. She appears well-developed and well-nourished.  HENT:  Head: Normocephalic.    Eyes: Pupils are equal, round, and reactive to light. EOM are normal.  Cardiovascular: Normal rate.  Pulmonary/Chest: Effort normal.  Neurological: She is alert and oriented to person, place, and time.  Psychiatric: She has a normal mood and affect. Her behavior is normal. Judgment and thought content normal.    Assessment & Plan:  Dog bite of face, initial encounter Recommend waiting for any intervention for around 3 months and allowing the body to do some healing. I think that it will likely get much better in time.  I think the Junius Argyle is a good idea.  I also recommend avoiding the sun exposure and massage to the area.  The patient is not thrilled with having to wait but understands why it is necessary.  She will follow-up in February.  She also asked  about the possibility of a forehead lift and blepharoplasty. Alena Billslaire S Dillingham, DO

## 2018-10-28 ENCOUNTER — Other Ambulatory Visit: Payer: Self-pay | Admitting: Family Medicine

## 2018-10-28 DIAGNOSIS — Z1231 Encounter for screening mammogram for malignant neoplasm of breast: Secondary | ICD-10-CM

## 2018-12-01 ENCOUNTER — Ambulatory Visit: Payer: Managed Care, Other (non HMO) | Admitting: Plastic Surgery

## 2018-12-08 ENCOUNTER — Ambulatory Visit: Payer: Managed Care, Other (non HMO) | Admitting: Plastic Surgery

## 2018-12-09 ENCOUNTER — Ambulatory Visit
Admission: RE | Admit: 2018-12-09 | Discharge: 2018-12-09 | Disposition: A | Discharge status: Home / Self Care | Payer: No Typology Code available for payment source | Source: Ambulatory Visit | Attending: Family Medicine | Admitting: Family Medicine

## 2018-12-09 DIAGNOSIS — Z1231 Encounter for screening mammogram for malignant neoplasm of breast: Secondary | ICD-10-CM

## 2018-12-16 ENCOUNTER — Other Ambulatory Visit: Payer: Self-pay | Admitting: Hematology and Oncology

## 2018-12-16 ENCOUNTER — Other Ambulatory Visit: Payer: Self-pay

## 2018-12-16 ENCOUNTER — Telehealth: Payer: Self-pay

## 2018-12-16 DIAGNOSIS — Z1239 Encounter for other screening for malignant neoplasm of breast: Secondary | ICD-10-CM

## 2018-12-16 NOTE — Progress Notes (Signed)
Pt called to see if pt could get an abbreviated breast MRI d/t history of dense breast. Pt has estimated 20-25% risk and per abbreviated breast MRI guidelines, test only performed if you are less than 20% risk. Pt has poor coverage for insurance and unable to come for a visit with Dr.Gudena, unless its absolutely necessary. Per MD, pt may go to her pcp and request to have her risk recalculated and if it falls below 20%, pt is able to qualify for the abbreviated breast MRI. Called to verify with breast center imaging and was told that pt will not be able to get abbreviated, given high risk history of 20-25%. Pt notified and will look into it further. Pt was thankful and appreciative of the information and call. She will be calling back if she decides to get a regular breast MRI instead.

## 2018-12-16 NOTE — Telephone Encounter (Signed)
Pt called to request a breast abbreviated MRI due to dense breast, after having 3d mm done last week. Will discuss with Dr.Gudena if this can be done. Will call pt back with update.

## 2019-05-31 DIAGNOSIS — M65332 Trigger finger, left middle finger: Secondary | ICD-10-CM | POA: Insufficient documentation

## 2019-10-12 ENCOUNTER — Telehealth: Payer: Self-pay | Admitting: *Deleted

## 2019-10-12 ENCOUNTER — Other Ambulatory Visit: Payer: Self-pay | Admitting: Hematology and Oncology

## 2019-10-12 DIAGNOSIS — Z1239 Encounter for other screening for malignant neoplasm of breast: Secondary | ICD-10-CM

## 2019-10-12 NOTE — Telephone Encounter (Signed)
LM that Dr Lindi Adie has placed an order for MRI to be scheduled

## 2019-10-12 NOTE — Telephone Encounter (Signed)
Pt had declined to schedule MR Breast in February because she was self pay. 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   She was concerned that Dr Lindi Adie had ordered a cheaper scan because she was going to have to pay for it out of pocket.

## 2019-11-16 ENCOUNTER — Ambulatory Visit
Admission: RE | Admit: 2019-11-16 | Discharge: 2019-11-16 | Disposition: A | Discharge status: Home / Self Care | Payer: 59 | Source: Ambulatory Visit | Attending: Hematology and Oncology | Admitting: Hematology and Oncology

## 2019-11-16 ENCOUNTER — Other Ambulatory Visit: Payer: Self-pay

## 2019-11-16 ENCOUNTER — Other Ambulatory Visit: Payer: Self-pay | Admitting: Hematology and Oncology

## 2019-11-16 ENCOUNTER — Other Ambulatory Visit: Payer: No Typology Code available for payment source

## 2019-11-16 DIAGNOSIS — Z1239 Encounter for other screening for malignant neoplasm of breast: Secondary | ICD-10-CM

## 2019-11-16 MED ORDER — GADOBUTROL 1 MMOL/ML IV SOLN
7.0000 mL | Freq: Once | INTRAVENOUS | Status: AC | PRN
  Administered 2019-11-16: 12:00:00 7 mL via INTRAVENOUS

## 2019-11-20 ENCOUNTER — Other Ambulatory Visit: Payer: Self-pay

## 2019-11-20 ENCOUNTER — Ambulatory Visit
Admission: RE | Admit: 2019-11-20 | Discharge: 2019-11-20 | Disposition: A | Discharge status: Home / Self Care | Payer: 59 | Source: Ambulatory Visit | Attending: Hematology and Oncology | Admitting: Hematology and Oncology

## 2019-11-20 DIAGNOSIS — Z1239 Encounter for other screening for malignant neoplasm of breast: Secondary | ICD-10-CM

## 2019-11-22 ENCOUNTER — Telehealth: Payer: Self-pay | Admitting: Hematology and Oncology

## 2019-11-22 NOTE — Telephone Encounter (Signed)
I informed the patient of the breast MRI was normal. Breast MRI was being done for high breast density.

## 2019-11-23 ENCOUNTER — Telehealth: Payer: Self-pay | Admitting: *Deleted

## 2019-11-23 NOTE — Telephone Encounter (Signed)
Received call from pt stating she had a breast MRI done yesterday and is wanting to know if she will need to have a mammogram preformed in the future.  Per MD because pt his High risk, she will need to rotate between a breast MRI and mammogram.  PT will be due for a mammogram in 6 months and another breast MRI 6 months from the mammogram.  Pt verbalized understanding and appreciative of the clarification.

## 2020-02-16 ENCOUNTER — Other Ambulatory Visit: Payer: Self-pay | Admitting: Hematology and Oncology

## 2020-02-16 DIAGNOSIS — Z1231 Encounter for screening mammogram for malignant neoplasm of breast: Secondary | ICD-10-CM

## 2020-02-21 ENCOUNTER — Other Ambulatory Visit: Payer: Self-pay

## 2020-02-21 ENCOUNTER — Ambulatory Visit
Admission: RE | Admit: 2020-02-21 | Discharge: 2020-02-21 | Disposition: A | Discharge status: Home / Self Care | Payer: 59 | Source: Ambulatory Visit | Attending: Hematology and Oncology | Admitting: Hematology and Oncology

## 2020-02-21 DIAGNOSIS — Z1231 Encounter for screening mammogram for malignant neoplasm of breast: Secondary | ICD-10-CM

## 2020-12-02 ENCOUNTER — Encounter (HOSPITAL_BASED_OUTPATIENT_CLINIC_OR_DEPARTMENT_OTHER): Payer: Self-pay | Admitting: Emergency Medicine

## 2020-12-02 ENCOUNTER — Other Ambulatory Visit: Payer: Self-pay

## 2020-12-02 ENCOUNTER — Emergency Department (HOSPITAL_BASED_OUTPATIENT_CLINIC_OR_DEPARTMENT_OTHER)
Admission: EM | Admit: 2020-12-02 | Discharge: 2020-12-03 | Disposition: A | Discharge status: Home / Self Care | Payer: No Typology Code available for payment source | Attending: Emergency Medicine | Admitting: Emergency Medicine

## 2020-12-02 ENCOUNTER — Emergency Department (HOSPITAL_BASED_OUTPATIENT_CLINIC_OR_DEPARTMENT_OTHER): Payer: No Typology Code available for payment source

## 2020-12-02 DIAGNOSIS — W540XXA Bitten by dog, initial encounter: Secondary | ICD-10-CM | POA: Diagnosis not present

## 2020-12-02 DIAGNOSIS — S51852A Open bite of left forearm, initial encounter: Secondary | ICD-10-CM | POA: Diagnosis not present

## 2020-12-02 DIAGNOSIS — Z79899 Other long term (current) drug therapy: Secondary | ICD-10-CM | POA: Insufficient documentation

## 2020-12-02 DIAGNOSIS — I10 Essential (primary) hypertension: Secondary | ICD-10-CM | POA: Insufficient documentation

## 2020-12-02 DIAGNOSIS — Z96651 Presence of right artificial knee joint: Secondary | ICD-10-CM | POA: Insufficient documentation

## 2020-12-02 MED ORDER — LIDOCAINE-EPINEPHRINE (PF) 2 %-1:200000 IJ SOLN
10.0000 mL | Freq: Once | INTRAMUSCULAR | Status: AC
  Administered 2020-12-02: 10 mL
  Filled 2020-12-02: qty 20

## 2020-12-02 MED ORDER — HYDROCODONE-ACETAMINOPHEN 5-325 MG PO TABS
1.0000 | ORAL_TABLET | Freq: Once | ORAL | Status: AC
  Administered 2020-12-02: 1 via ORAL
  Filled 2020-12-02: qty 1

## 2020-12-02 NOTE — ED Triage Notes (Signed)
Reports bitten on left arm by her own dog. States dog is up to date on vaccines, her tdap was updated already this week - her dog bite her this week prior to today. Laceration to left forearmx2.

## 2020-12-02 NOTE — ED Provider Notes (Incomplete)
[facility] EMERGENCY DEPARTMENT Provider Note   CSN: [id number] Arrival date & time: 12/02/20  2024     History Chief Complaint  Patient presents with  . Animal Bite    Ashlee Robbins is a 60 y.o. female with PMHx HTN   HPI     Past Medical History:  Diagnosis Date  . Breast cancer screening, high risk patient 12/09/2012  . Hx of cervical cancer   . Hypertension     Patient Active Problem List   Diagnosis Date Noted  . Trigger middle finger of left hand 05/31/2019  . Pain of right hand 06/01/2018  . Torn ear lobe 08/01/2017  . S/P TKR (total knee replacement) using cement, 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  . Anxiety disorder 06/17/2016  . Primary insomnia 06/17/2016  . Vitamin D deficiency 06/17/2016  . Essential hypertension 12/19/2015  . Vitamin B12 deficiency 12/19/2015  . Right rotator cuff tendinitis 05/24/2014  . Follicular cyst of skin and subcutaneous tissue 12/21/2013  . Piriformis syndrome of right side 08/05/2013  . 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  . Breast cancer screening, high risk patient 12/09/2012  . HIP PAIN, RIGHT 06/08/2010  . Chronic pain of right knee 06/08/2010  . MENISCUS TEAR, RIGHT 06/08/2010    Past Surgical History:  Procedure Laterality Date  . REPLACEMENT TOTAL KNEE    . TUBAL LIGATION       OB History    Gravida  1   Para  0   Term      Preterm      AB  1   Living  0     SAB      IAB      Ectopic      Multiple      Live Births              Family History  Problem Relation Age of Onset  . Cancer Mother        THYROID  . Breast cancer Mother 85  . Diabetes Father   . Cancer Sister        THYROID  . Breast cancer Sister 18  . Breast cancer Brother        THYROID  . Hypertension Brother   . Breast cancer Maternal Grandmother   . Breast cancer Maternal Aunt     Social History   Tobacco Use  . Smoking status: Never Smoker  . Smokeless tobacco: Never Used  Substance Use  Topics  . Alcohol use: Yes  . Drug use: No    Home Medications Prior to Admission medications   Medication Sig Start Date End Date Taking? Authorizing Provider  amLODipine (NORVASC) 5 MG tablet  07/26/13   [provider]  baclofen (LIORESAL) 20 MG tablet  12/20/15   [provider]  cyanocobalamin (,VITAMIN B-12,) 1000 MCG/ML injection ADM 1 ML IM 1 TIME A MONTH 11/12/15   [provider]  fluticasone (FLONASE) 50 MCG/ACT nasal spray Place into the nose.    [provider]  HYDROcodone-acetaminophen (NORCO/VICODIN) 5-325 MG tablet Take 1 tablet by mouth every 4 (four) hours as needed for severe pain. 09/12/18   Elpidio Anis, PA-C  LORazepam (ATIVAN) 1 MG tablet Take 2 tablets (2 mg total) by mouth once. 12/04/15   Serena Croissant, MD  losartan (COZAAR) 100 MG tablet Take 1 tablet (100 mg total) by mouth daily. 12/04/15   Serena Croissant, MD  valACYclovir (VALTREX) 1000 MG tablet Take by mouth.    [provider]  Vitamin D, Ergocalciferol, (DRISDOL) 50000 units CAPS capsule TK 1 C PO WEEKLY 11/13/15   [provider]    Allergies    Patient has no known allergies.  Review of Systems   Review of Systems  Physical Exam Updated Vital Signs BP (!) 156/113   Pulse 90   Temp 98.3 F (36.8 C)   Resp 16   Wt 72.3 kg   SpO2 98%   BMI 24.98 kg/m   Physical Exam     ED Results / Procedures / Treatments   Labs (all labs ordered are listed, but only abnormal results are displayed) Labs Reviewed - No data to display  EKG None  Radiology No results found.  Procedures Procedures {Remember to document critical care time when appropriate:1}  Medications Ordered in ED Medications  lidocaine-EPINEPHrine (XYLOCAINE W/EPI) 2 %-1:200000 (PF) injection 10 mL (has no administration in time range)    ED Course  I have reviewed the triage vital signs and the nursing notes.  Pertinent labs & imaging results that were available during  my care of the patient were reviewed by me and considered in my medical decision making (see chart for details).    MDM Rules/Calculators/A&P                          *** Final Clinical Impression(s) / ED Diagnoses Final diagnoses:  None    Rx / DC Orders ED Discharge Orders    None

## 2020-12-02 NOTE — ED Notes (Signed)
ED Provider at bedside. Hyman Hopes, [place]

## 2020-12-02 NOTE — ED Provider Notes (Signed)
[facility] EMERGENCY DEPARTMENT Provider Note   CSN: [id number] Arrival date & time: 12/02/20  2024     History Chief Complaint  Patient presents with   Animal Bite    Ashlee Robbins is a 60 y.o. female with PMHx HTN who presents to the ED today with complaint of animal bite to the left forearm that occurred 3 hours PTA.  Patient reports that she was breaking up a dog fight between 2 of her dogs when they accidentally bit her on the left forearm.  States that the same thing happened yesterday, she went to urgent care after sustaining a small dog bite to her right hand.  She was prescribed 10 days of Augmentin and has been taking it for a full day so far.  She states that she is currently looking to rehome her dog who bit her today as this is now a recurrent issue.  Her dogs are all up-to-date on the rabies vaccine and patient is up-to-date on tetanus.  She has no other complaints at this time.  The history is provided by the patient and medical records.       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   Anxiety disorder 06/17/2016   Primary insomnia 06/17/2016   Vitamin D deficiency 06/17/2016   Essential hypertension 12/19/2015   Vitamin B12 deficiency 12/19/2015   Right rotator cuff tendinitis 05/24/2014   Follicular cyst of skin and subcutaneous tissue 12/21/2013   Piriformis syndrome of right side 08/05/2013   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   Breast cancer screening, high risk patient 12/09/2012   HIP PAIN, RIGHT 06/08/2010   Chronic pain of right knee 06/08/2010   MENISCUS TEAR, RIGHT 06/08/2010     Past Surgical History:  Procedure Laterality Date   REPLACEMENT TOTAL KNEE     TUBAL LIGATION       OB History    Gravida  1   Para  0   Term      Preterm      AB  1   Living  0     SAB      IAB      Ectopic      Multiple      Live Births              Family History  Problem Relation Age of Onset   Cancer Mother        THYROID   Breast cancer Mother 61   Diabetes Father    Cancer Sister        THYROID   Breast cancer Sister 53   Breast cancer Brother        THYROID   Hypertension Brother    Breast cancer Maternal Grandmother    Breast cancer Maternal Aunt     Social History   Tobacco Use   Smoking status: Never Smoker   Smokeless tobacco: Never Used  Substance Use Topics   Alcohol use: Yes   Drug use: No    Home Medications Prior to Admission medications   Medication Sig Start Date End Date Taking? Authorizing Provider  HYDROcodone-acetaminophen (NORCO/VICODIN) 5-325 MG tablet Take 1 tablet by mouth every 6 (six) hours as needed for severe pain. 12/03/20  Yes Hyman HopesVenter, Zala Degrasse, PA-C  amLODipine (NORVASC) 5 MG tablet  07/26/13   [provider]  baclofen (LIORESAL) 20 MG tablet  12/20/15   [provider]  cyanocobalamin (,VITAMIN B-12,) 1000 MCG/ML injection ADM 1 ML IM 1 TIME A MONTH 11/12/15   [provider]  fluticasone (FLONASE) 50 MCG/ACT nasal spray Place into the nose.    [provider]  HYDROcodone-acetaminophen (NORCO/VICODIN) 5-325 MG tablet Take 1 tablet by mouth every 4 (four) hours as needed for severe pain. 09/12/18   Elpidio AnisUpstill, Shari, PA-C  LORazepam (ATIVAN) 1 MG tablet Take 2 tablets (2 mg total) by mouth once. 12/04/15   Serena CroissantGudena, Vinay, MD  losartan (COZAAR) 100 MG tablet Take 1 tablet (100 mg total) by mouth daily. 12/04/15   Serena CroissantGudena, Vinay, MD  valACYclovir (VALTREX) 1000 MG tablet Take by mouth.    [provider]  Vitamin D, Ergocalciferol, (DRISDOL) 50000 units CAPS  capsule TK 1 C PO WEEKLY 11/13/15   [provider]    Allergies    Patient has no known allergies.  Review of Systems   Review of Systems  Constitutional: Negative for chills and fever.  Musculoskeletal: Positive for arthralgias.  Skin: Positive for wound.  All other systems reviewed and are negative.   Physical Exam Updated Vital Signs BP (!) 156/113    Pulse 90    Temp 98.3 F (36.8 C)    Resp 16    Wt 72.3 kg    SpO2 98%    BMI 24.98 kg/m   Physical Exam Vitals and nursing note reviewed.  Constitutional:      Appearance: She is not ill-appearing.  HENT:     Head: Normocephalic and atraumatic.  Eyes:     Conjunctiva/sclera: Conjunctivae normal.  Cardiovascular:     Rate and Rhythm: Normal rate and regular rhythm.     Pulses: Normal pulses.  Pulmonary:     Effort: Pulmonary effort is normal.     Breath sounds: Normal breath sounds. No wheezing, rhonchi or rales.  Abdominal:     Palpations: Abdomen is soft.     Tenderness: There is no abdominal tenderness.  Musculoskeletal:     Cervical back: Neck supple.     Comments: See photos below. 3 cm gaping wound to dorsal aspect of left forearm as well as puncture wounds noted to the ventral aspect; bleeding controlled.  ROM intact to elbow and wrist with flexion and extension. Able to wiggle all fingers without difficulty. Cap refill < 2 seconds. 2+ radial pulse.   Skin:    General: Skin is warm and dry.  Neurological:     Mental Status: She is alert.            ED Results / Procedures / Treatments   Labs (all labs ordered are listed, but only abnormal results are displayed) Labs Reviewed - No data to display  EKG None  Radiology DG Forearm Left  Result Date: 12/02/2020 CLINICAL DATA:  Dog bite to the left forearm. EXAM: LEFT FOREARM - 2 VIEW COMPARISON:  None. FINDINGS: The cortical margins of the radius and ulna are intact. There is no evidence of fracture or other focal bone lesions. Soft tissue  edema and focal soft tissue prominence overlies the mid dorsal medial aspect of the forearm with minimal soft tissue air. This is at the arrow placed by technologist  denoting site of laceration. No radiopaque foreign body. IMPRESSION: Soft tissue edema and focal soft tissue prominence over the mid dorsal medial aspect of the forearm at site of dog bite. No radiopaque foreign body or acute osseous abnormality. Electronically Signed   By: Narda Rutherford M.D.   On: 12/02/2020 23:15    Procedures .Marland KitchenLaceration Repair  Date/Time: 12/03/2020 12:07 AM Performed by: Tanda Rockers, PA-C Authorized by: Tanda Rockers, PA-C   Consent:    Consent obtained:  Verbal   Consent given by:  Patient   Risks discussed:  Infection, pain, need for additional repair, poor cosmetic result and poor wound healing Anesthesia:    Anesthesia method:  Local infiltration   Local anesthetic:  Lidocaine 2% WITH epi Laceration details:    Location:  Shoulder/arm   Shoulder/arm location:  L lower arm   Length (cm):  3   Depth (mm):  5 Pre-procedure details:    Preparation:  Patient was prepped and draped in usual sterile fashion Exploration:    Imaging obtained: x-ray     Imaging outcome: foreign body not noted     Wound exploration: wound explored through full range of motion and entire depth of wound visualized   Treatment:    Area cleansed with:  Povidone-iodine   Irrigation solution:  Sterile saline   Irrigation volume:  1 L   Irrigation method:  Pressure wash   Visualized foreign bodies/material removed: no   Skin repair:    Repair method:  Sutures   Suture size:  4-0   Suture material:  Prolene   Suture technique:  Simple interrupted   Number of sutures:  3 Approximation:    Approximation:  Loose Repair type:    Repair type:  Intermediate Post-procedure details:    Dressing:  Non-adherent dressing   Procedure completion:  Tolerated well, no immediate complications     Medications Ordered in  ED Medications  lidocaine-EPINEPHrine (XYLOCAINE W/EPI) 2 %-1:200000 (PF) injection 10 mL (10 mLs Infiltration Given 12/02/20 2340)  HYDROcodone-acetaminophen (NORCO/VICODIN) 5-325 MG per tablet 1 tablet (1 tablet Oral Given 12/02/20 2348)    ED Course  I have reviewed the triage vital signs and the nursing notes.  Pertinent labs & imaging results that were available during my care of the patient were reviewed by me and considered in my medical decision making (see chart for details).    MDM Rules/Calculators/A&P                          60 year old female presents to the ED today after sustaining a dog bite to her left forearm.  Incidentally she had a dog bite to her right hand yesterday, seen at urgent care and prescribed Augmentin which she has been taking.  On arrival to the ED vitals are stable.  Patient is afebrile, nontachycardic nontachypneic.  She is up-to-date on tetanus and all her dogs are up-to-date on the rabies vaccines.  She is currently looking to rehome her older dog who is currently causing fights with the younger dog causing her to have to break up the fights.  Animal control does not need to be contacted at this time.  On exam patient has a 3 cm gaping wound to her left forearm on the dorsal aspect that will likely require loose sutures.  Will obtain an x-ray at this time to ensure there is no foreign bodies.   X-ray negative.  Wound loosely closed with 4-0 Prolene sutures, 3 sutures  placed.  Will discharge home at this time.  Will provide a very short course of pain medication for patient.  Have encouraged that she return to the ED in 48 hours for wound check.  She has Augmentin at home, have advised to continue taking until entire course is completed.  She is instructed on rice therapy as well as she will likely be very sore tomorrow.  She is in agreement with plan is stable for discharge.   This note was prepared using Dragon voice recognition software and may include  unintentional dictation errors due to the inherent limitations of voice recognition software.  Final Clinical Impression(s) / ED Diagnoses Final diagnoses:  Dog bite, initial encounter    Rx / DC Orders ED Discharge Orders         Ordered    HYDROcodone-acetaminophen (NORCO/VICODIN) 5-325 MG tablet  Every 6 hours PRN        12/03/20 0021           Discharge Instructions     Return to the ED in 48 hours for wound check/to see if sutures need to be taken out.   Continue taking your antibiotics as prescribed. It is very important to finish the entire course of antibiotics to prevent infection.   Keep wound clean and dry. It is recommended that you leave wound open to allow for air movement/prevent infection.   I have also prescribed a very short course of pain medication to take as needed for severe pain. I would recommend 600 mg Ibuprofen every 6-8 hours as needed for pain and to take the Norco for break through pain.   While at home please rest, ice, and elevate your left arm to help with pain/swelling.   Return to the ED IMMEDIATELY for any signs of infection including redness/swelling around the wound, drainage of pus, fevers > 100.4, chills, or any other new/concerning symptoms.        Tanda Rockers, PA-C 12/03/20 0023    Maia Plan, MD 12/03/20 [phone]

## 2020-12-02 NOTE — ED Notes (Signed)
ED Provider at bedside. 

## 2020-12-03 MED ORDER — HYDROCODONE-ACETAMINOPHEN 5-325 MG PO TABS: 1.0000 | ORAL_TABLET | Freq: Four times a day (QID) | ORAL | 0 refills | Status: DC | PRN

## 2020-12-03 NOTE — Discharge Instructions (Addendum)
Return to the ED in 48 hours for wound check/to see if sutures need to be taken out.   Continue taking your antibiotics as prescribed. It is very important to finish the entire course of antibiotics to prevent infection.   Keep wound clean and dry. It is recommended that you leave wound open to allow for air movement/prevent infection.   I have also prescribed a very short course of pain medication to take as needed for severe pain. I would recommend 600 mg Ibuprofen every 6-8 hours as needed for pain and to take the Norco for break through pain.   While at home please rest, ice, and elevate your left arm to help with pain/swelling.   Return to the ED IMMEDIATELY for any signs of infection including redness/swelling around the wound, drainage of pus, fevers > 100.4, chills, or any other new/concerning symptoms.

## 2021-01-15 ENCOUNTER — Other Ambulatory Visit: Payer: Self-pay | Admitting: Family Medicine

## 2021-01-15 DIAGNOSIS — Z1231 Encounter for screening mammogram for malignant neoplasm of breast: Secondary | ICD-10-CM

## 2021-03-12 ENCOUNTER — Other Ambulatory Visit: Payer: Self-pay

## 2021-03-12 ENCOUNTER — Ambulatory Visit
Admission: RE | Admit: 2021-03-12 | Discharge: 2021-03-12 | Disposition: A | Discharge status: Home / Self Care | Payer: No Typology Code available for payment source | Source: Ambulatory Visit | Attending: Family Medicine | Admitting: Family Medicine

## 2021-03-12 DIAGNOSIS — Z1231 Encounter for screening mammogram for malignant neoplasm of breast: Secondary | ICD-10-CM

## 2021-04-18 ENCOUNTER — Encounter: Payer: Self-pay | Admitting: *Deleted

## 2021-04-18 NOTE — Progress Notes (Signed)
Received call from pt requesting advice from MD if okay to proceed with Estradiol cream due to vaginal dryness.  Per MD okay for pt to proceed with tx. Pt verbalized understanding.  RN attempt to schedule pt for yearly f/u.  Pt states she is currently driving and will call the office to schedule apt.

## 2021-10-31 DIAGNOSIS — M62838 Other muscle spasm: Secondary | ICD-10-CM | POA: Diagnosis not present

## 2021-10-31 DIAGNOSIS — N3941 Urge incontinence: Secondary | ICD-10-CM | POA: Diagnosis not present

## 2021-11-16 DIAGNOSIS — M25511 Pain in right shoulder: Secondary | ICD-10-CM | POA: Diagnosis not present

## 2021-11-19 DIAGNOSIS — R5383 Other fatigue: Secondary | ICD-10-CM | POA: Diagnosis not present

## 2021-11-19 DIAGNOSIS — I10 Essential (primary) hypertension: Secondary | ICD-10-CM | POA: Diagnosis not present

## 2021-12-14 ENCOUNTER — Other Ambulatory Visit: Payer: Self-pay | Admitting: Family Medicine

## 2021-12-14 DIAGNOSIS — Z1231 Encounter for screening mammogram for malignant neoplasm of breast: Secondary | ICD-10-CM

## 2022-01-31 HISTORY — PX: SHOULDER OPEN ROTATOR CUFF REPAIR: SHX2407

## 2022-03-13 ENCOUNTER — Ambulatory Visit: Payer: No Typology Code available for payment source

## 2022-03-19 ENCOUNTER — Other Ambulatory Visit: Payer: Self-pay | Admitting: Obstetrics and Gynecology

## 2022-03-19 DIAGNOSIS — R5381 Other malaise: Secondary | ICD-10-CM

## 2022-03-20 ENCOUNTER — Ambulatory Visit
Admission: RE | Admit: 2022-03-20 | Discharge: 2022-03-20 | Disposition: A | Discharge status: Home / Self Care | Payer: BC Managed Care – PPO | Source: Ambulatory Visit | Attending: Family Medicine | Admitting: Family Medicine

## 2022-03-20 DIAGNOSIS — Z1231 Encounter for screening mammogram for malignant neoplasm of breast: Secondary | ICD-10-CM

## 2022-03-21 ENCOUNTER — Other Ambulatory Visit: Payer: Self-pay | Admitting: Obstetrics and Gynecology

## 2022-03-21 DIAGNOSIS — E2839 Other primary ovarian failure: Secondary | ICD-10-CM

## 2022-03-21 DIAGNOSIS — Z780 Asymptomatic menopausal state: Secondary | ICD-10-CM

## 2022-06-18 ENCOUNTER — Telehealth: Payer: Self-pay | Admitting: *Deleted

## 2022-06-18 NOTE — Telephone Encounter (Signed)
Received call from pt requesting advice from MD if q2 year Breast MRI is needed.  Pt last seen in our office in 2017.  RN sent message to scheduling team to schedule f/u to discuss need for continuation of breast MRI's.

## 2022-06-28 ENCOUNTER — Telehealth: Payer: Self-pay | Admitting: Hematology and Oncology

## 2022-06-28 NOTE — Telephone Encounter (Signed)
Scheduled appointment per 8/28 staff message. Patient is aware.

## 2022-07-18 NOTE — Progress Notes (Signed)
Patient Care Team: Madelaine Bhat., MD as PCP - General (Family Medicine)  DIAGNOSIS:  Encounter Diagnosis  Name Primary?   At high risk for breast cancer Yes      CHIEF COMPLIANT: Follow-up to discuss Mri  INTERVAL HISTORY: Ashlee Robbins is a 61 y.o with above mentioned. She presents to the clinic for a follow-up. Her main concern is getting her Mri done before next year since she has good insurance.  She is high risk for breast cancer and somehow because of insurance reasons MRI was not done over the past few years.  She once again approached [place] because her insurance has changed and she is now able to get breast MRI.    ALLERGIES:  has No Known Allergies.  MEDICATIONS:  Current Outpatient Medications  Medication Sig Dispense Refill   diazepam (VALIUM) 5 MG tablet Take 1 tablet (5 mg total) by mouth once for 1 dose. 1 tablet 0   amLODipine (NORVASC) 5 MG tablet      fluticasone (FLONASE) 50 MCG/ACT nasal spray Place into the nose.     losartan (COZAAR) 100 MG tablet Take 1 tablet (100 mg total) by mouth daily.     No current facility-administered medications for this visit.    PHYSICAL EXAMINATION: ECOG PERFORMANCE STATUS: 1 - Symptomatic but completely ambulatory  Vitals:   07/22/22 1057  BP: 134/88  Pulse: 72  Resp: 17  Temp: 97.9 F (36.6 C)  SpO2: 98%   Filed Weights   07/22/22 1057  Weight: 150 lb 14.4 oz (68.4 kg)      LABORATORY DATA:  I have reviewed the data as listed    Latest Ref Rng & Units 12/09/2012   12:30 PM 11/26/2010    1:39 PM 09/04/2010    9:25 AM  CMP  Glucose 70 - 99 mg/dl 74  85  79   BUN 7.0 - 26.0 mg/dL 21.$RemoveBefor'9  16  17   'CTYoodvthFur$ Creatinine 0.6 - 1.1 mg/dL 1.0  0.91  0.92   Sodium 136 - 145 mEq/L 144  140  142   Potassium 3.5 - 5.1 mEq/L 4.0  4.2  4.3   Chloride 98 - 107 mEq/L 107  101  105   CO2 22 - 29 mEq/L $Remove'28  26  29   'zVgDPCA$ Calcium 8.4 - 10.4 mg/dL 9.3  10.1  9.4   Total Protein 6.4 - 8.3 g/dL 7.2  7.2  6.9   Total Bilirubin 0.20 - 1.20  mg/dL 0.59  0.6  1.0   Alkaline Phos 40 - 150 U/L 54  44  41   AST 5 - 34 U/L $Remo'21  26  27   'oGhwS$ ALT 0 - 55 U/L $Remo'19  18  19     'JeOco$ Lab Results  Component Value Date   WBC 5.6 12/09/2012   HGB 14.3 12/09/2012   HCT 41.9 12/09/2012   MCV 100.0 12/09/2012   PLT 284 12/09/2012   NEUTROABS 3.5 12/09/2012    ASSESSMENT & PLAN:  At high risk for breast cancer High-risk for developing breast cancer due to her family history. Her breast cancer risk estimated at 20-25%. Patient has had BRCA1 and 2 testing performed which was negative for both. Could not tolerate Evista. Currently on surveillance.   Breast Cancer Surveillance: 1. Breast exam 07/22/2022: Normal 2. Mammogram 03/21/2022 no abnormalities.  Breast Density Category C.  3.  Breast MRI 11/22/2019: Benign, breast density category B  Return to clinic in 1 year for follow-up  Orders Placed This Encounter  Procedures   MR BREAST BILATERAL W WO CONTRAST INC CAD    Standing Status:   Future    Standing Expiration Date:   07/23/2023    Order Specific Question:   If indicated for the ordered procedure, I authorize the administration of contrast media per Radiology protocol    Answer:   Yes    Order Specific Question:   What is the patient's sedation requirement?    Answer:   No Sedation    Order Specific Question:   Does the patient have a pacemaker or implanted devices?    Answer:   No    Order Specific Question:   Preferred imaging location?    Answer:   [facility] (table limit-550lbs)   The patient has a good understanding of the overall plan. she agrees with it. she will call with any problems that may develop before the next visit here. Total time spent: 30 mins including face to face time and time spent for planning, charting and co-ordination of care   Harriette Ohara, MD 07/22/22    I Gardiner Coins am scribing for Dr. Lindi Adie  I have reviewed the above documentation for accuracy and completeness, and I agree with the  above.

## 2022-07-19 ENCOUNTER — Other Ambulatory Visit: Payer: Self-pay | Admitting: Otolaryngology

## 2022-07-22 ENCOUNTER — Inpatient Hospital Stay: Payer: BC Managed Care – PPO | Attending: Hematology and Oncology | Admitting: Hematology and Oncology

## 2022-07-22 ENCOUNTER — Other Ambulatory Visit: Payer: Self-pay

## 2022-07-22 VITALS — BP 134/88 | HR 72 | Temp 97.9°F | Resp 17 | Ht 67.0 in | Wt 150.9 lb

## 2022-07-22 DIAGNOSIS — Z9189 Other specified personal risk factors, not elsewhere classified: Secondary | ICD-10-CM | POA: Insufficient documentation

## 2022-07-22 DIAGNOSIS — Z1501 Genetic susceptibility to malignant neoplasm of breast: Secondary | ICD-10-CM | POA: Insufficient documentation

## 2022-07-22 DIAGNOSIS — Z803 Family history of malignant neoplasm of breast: Secondary | ICD-10-CM | POA: Diagnosis not present

## 2022-07-22 MED ORDER — DIAZEPAM 5 MG PO TABS: 5.0000 mg | ORAL_TABLET | Freq: Once | ORAL | 0 refills | Status: AC

## 2022-07-22 NOTE — Assessment & Plan Note (Signed)
High-risk for developing breast cancer due to her family history. Her breast cancer risk estimated at 20-25%. Patient has had BRCA1 and 2 testing performed which was negative for both. Could not tolerate Evista. Currently on surveillance.  Breast Cancer Surveillance: 1. Breast exam 07/22/2022: Normal 2. Mammogram 03/21/2022 no abnormalities. Breast Density Category C.  3.  Breast MRI 11/22/2019: Benign, breast density category B  Return to clinic in 1 year for follow-up

## 2022-07-31 ENCOUNTER — Other Ambulatory Visit: Payer: Self-pay

## 2022-07-31 ENCOUNTER — Encounter (HOSPITAL_BASED_OUTPATIENT_CLINIC_OR_DEPARTMENT_OTHER): Payer: Self-pay | Admitting: Otolaryngology

## 2022-08-07 ENCOUNTER — Encounter (HOSPITAL_BASED_OUTPATIENT_CLINIC_OR_DEPARTMENT_OTHER)
Admission: RE | Disposition: A | Discharge status: Home / Self Care | Payer: Self-pay | Source: Ambulatory Visit | Attending: Otolaryngology

## 2022-08-07 ENCOUNTER — Encounter (HOSPITAL_BASED_OUTPATIENT_CLINIC_OR_DEPARTMENT_OTHER): Payer: Self-pay | Admitting: Otolaryngology

## 2022-08-07 ENCOUNTER — Ambulatory Visit (HOSPITAL_BASED_OUTPATIENT_CLINIC_OR_DEPARTMENT_OTHER): Payer: BC Managed Care – PPO | Admitting: Certified Registered"

## 2022-08-07 ENCOUNTER — Other Ambulatory Visit: Payer: Self-pay

## 2022-08-07 ENCOUNTER — Ambulatory Visit (HOSPITAL_BASED_OUTPATIENT_CLINIC_OR_DEPARTMENT_OTHER)
Admission: RE | Admit: 2022-08-07 | Discharge: 2022-08-07 | Disposition: A | Discharge status: Home / Self Care | Payer: BC Managed Care – PPO | Source: Ambulatory Visit | Attending: Otolaryngology | Admitting: Otolaryngology

## 2022-08-07 DIAGNOSIS — F419 Anxiety disorder, unspecified: Secondary | ICD-10-CM | POA: Diagnosis not present

## 2022-08-07 DIAGNOSIS — I10 Essential (primary) hypertension: Secondary | ICD-10-CM | POA: Insufficient documentation

## 2022-08-07 DIAGNOSIS — G4733 Obstructive sleep apnea (adult) (pediatric): Secondary | ICD-10-CM | POA: Insufficient documentation

## 2022-08-07 HISTORY — DX: Sleep apnea, unspecified: G47.30

## 2022-08-07 HISTORY — PX: DRUG INDUCED ENDOSCOPY: SHX6808

## 2022-08-07 SURGERY — DRUG INDUCED SLEEP ENDOSCOPY
Anesthesia: Monitor Anesthesia Care | Site: Nose | Laterality: Bilateral

## 2022-08-07 MED ORDER — OXYMETAZOLINE HCL 0.05 % NA SOLN
NASAL | Status: DC | PRN
  Administered 2022-08-07: 1 via TOPICAL

## 2022-08-07 MED ORDER — PROPOFOL 10 MG/ML IV BOLUS
INTRAVENOUS | Status: AC
  Filled 2022-08-07: qty 20

## 2022-08-07 MED ORDER — PROMETHAZINE HCL 25 MG/ML IJ SOLN: 6.2500 mg | INTRAMUSCULAR | Status: DC | PRN

## 2022-08-07 MED ORDER — PROPOFOL 10 MG/ML IV BOLUS
INTRAVENOUS | Status: DC | PRN
  Administered 2022-08-07: 20 mg via INTRAVENOUS
  Administered 2022-08-07: 10 mg via INTRAVENOUS
  Administered 2022-08-07 (×3): 20 mg via INTRAVENOUS

## 2022-08-07 MED ORDER — ONDANSETRON HCL 4 MG/2ML IJ SOLN
INTRAMUSCULAR | Status: AC
  Filled 2022-08-07: qty 2

## 2022-08-07 MED ORDER — ACETAMINOPHEN 10 MG/ML IV SOLN: 1000.0000 mg | Freq: Once | INTRAVENOUS | Status: DC | PRN

## 2022-08-07 MED ORDER — OXYCODONE HCL 5 MG/5ML PO SOLN: 5.0000 mg | Freq: Once | ORAL | Status: DC | PRN

## 2022-08-07 MED ORDER — FENTANYL CITRATE (PF) 100 MCG/2ML IJ SOLN: 25.0000 ug | INTRAMUSCULAR | Status: DC | PRN

## 2022-08-07 MED ORDER — KETOROLAC TROMETHAMINE 30 MG/ML IJ SOLN: 30.0000 mg | Freq: Once | INTRAMUSCULAR | Status: DC

## 2022-08-07 MED ORDER — PROPOFOL 500 MG/50ML IV EMUL
INTRAVENOUS | Status: AC
  Filled 2022-08-07: qty 50

## 2022-08-07 MED ORDER — PROPOFOL 500 MG/50ML IV EMUL
INTRAVENOUS | Status: DC | PRN
  Administered 2022-08-07: 100 ug/kg/min via INTRAVENOUS

## 2022-08-07 MED ORDER — LACTATED RINGERS IV SOLN: INTRAVENOUS | Status: DC

## 2022-08-07 MED ORDER — ONDANSETRON HCL 4 MG/2ML IJ SOLN
INTRAMUSCULAR | Status: DC | PRN
  Administered 2022-08-07: 4 mg via INTRAVENOUS

## 2022-08-07 MED ORDER — OXYCODONE HCL 5 MG PO TABS: 5.0000 mg | ORAL_TABLET | Freq: Once | ORAL | Status: DC | PRN

## 2022-08-07 MED ORDER — AMISULPRIDE (ANTIEMETIC) 5 MG/2ML IV SOLN: 10.0000 mg | Freq: Once | INTRAVENOUS | Status: DC | PRN

## 2022-08-07 SURGICAL SUPPLY — 12 items
CANISTER SUCT 1200ML W/VALVE (MISCELLANEOUS) ×1 IMPLANT
GLOVE BIO SURGEON STRL SZ7.5 (GLOVE) ×1 IMPLANT
KIT CLEAN ENDO (MISCELLANEOUS) ×1 IMPLANT
NDL HYPO 27GX1-1/4 (NEEDLE) IMPLANT
NEEDLE HYPO 27GX1-1/4 (NEEDLE) IMPLANT
PATTIES SURGICAL .5 X3 (DISPOSABLE) ×1 IMPLANT
SHEET MEDIUM DRAPE 40X70 STRL (DRAPES) ×1 IMPLANT
SOL ANTI FOG 6CC (MISCELLANEOUS) ×1 IMPLANT
SOLUTION ANTI FOG 6CC (MISCELLANEOUS) ×1
SYR CONTROL 10ML LL (SYRINGE) IMPLANT
TOWEL GREEN STERILE FF (TOWEL DISPOSABLE) ×1 IMPLANT
TUBE CONNECTING 20X1/4 (TUBING) ×1 IMPLANT

## 2022-08-07 NOTE — Brief Op Note (Signed)
08/07/2022  9:31 AM  PATIENT:  Ashlee Robbins  61 y.o. female  PRE-OPERATIVE DIAGNOSIS:  Obstructive Sleep Apnea BMI 24.0-24.9, adult  POST-OPERATIVE DIAGNOSIS:  Obstructive Sleep Apnea  PROCEDURE:  Procedure(s): DRUG INDUCED ENDOSCOPY (Bilateral)  SURGEON:  Surgeon(s) and Role:    Melida Quitter, MD - Primary  PHYSICIAN ASSISTANT:   ASSISTANTS: none   ANESTHESIA:   IV sedation  EBL:  None   BLOOD ADMINISTERED:none  DRAINS: none   LOCAL MEDICATIONS USED:  NONE  SPECIMEN:  No Specimen  DISPOSITION OF SPECIMEN:  N/A  COUNTS:  YES  TOURNIQUET:  * No tourniquets in log *  DICTATION: .Note written in EPIC  PLAN OF CARE: Discharge to home after PACU  PATIENT DISPOSITION:  PACU - hemodynamically stable.   Delay start of Pharmacological VTE agent (>24hrs) due to surgical blood loss or risk of bleeding: no

## 2022-08-07 NOTE — H&P (Signed)
Ashlee Robbins is an 61 y.o. female.   Chief Complaint: Sleep apnea HPI: 61 year old female with obstructive sleep apnea who has been unable to tolerate CPAP.  Past Medical History:  Diagnosis Date   Breast cancer screening, high risk patient 12/09/2012   Hx of cervical cancer    Hypertension    Sleep apnea     Past Surgical History:  Procedure Laterality Date   REPLACEMENT TOTAL KNEE     SHOULDER OPEN ROTATOR CUFF REPAIR Right 01/31/2022   TUBAL LIGATION      Family History  Problem Relation Age of Onset   Cancer Mother        THYROID   Breast cancer Mother 36   Diabetes Father    Thyroid cancer Maternal Aunt    Breast cancer Maternal Grandmother    Cancer Brother        THYROID   Hypertension Brother    Social History:  reports that she has never smoked. She has never used smokeless tobacco. She reports current alcohol use. She reports that she does not use drugs.  Allergies: No Known Allergies  Medications Prior to Admission  Medication Sig Dispense Refill   amLODipine (NORVASC) 5 MG tablet      losartan (COZAAR) 100 MG tablet Take 1 tablet (100 mg total) by mouth daily.     sertraline (ZOLOFT) 25 MG tablet Take 25 mg by mouth daily.      No results found for this or any previous visit (from the past 48 hour(s)). No results found.  Review of Systems  All other systems reviewed and are negative.   Blood pressure (!) 134/103, pulse 64, temperature (!) 97.2 F (36.2 C), temperature source Oral, resp. rate 15, height 5\' 7"  (1.702 m), weight 69.5 kg. Physical Exam Constitutional:      Appearance: Normal appearance. She is normal weight.  HENT:     Head: Normocephalic and atraumatic.     Right Ear: External ear normal.     Left Ear: External ear normal.     Nose: Nose normal.     Mouth/Throat:     Mouth: Mucous membranes are moist.     Pharynx: Oropharynx is clear.  Eyes:     Extraocular Movements: Extraocular movements intact.     Conjunctiva/sclera:  Conjunctivae normal.     Pupils: Pupils are equal, round, and reactive to light.  Cardiovascular:     Rate and Rhythm: Normal rate.  Pulmonary:     Effort: Pulmonary effort is normal.  Musculoskeletal:     Cervical back: Normal range of motion.  Skin:    General: Skin is warm and dry.  Neurological:     General: No focal deficit present.     Mental Status: She is alert and oriented to person, place, and time.  Psychiatric:        Mood and Affect: Mood normal.        Behavior: Behavior normal.        Thought Content: Thought content normal.        Judgment: Judgment normal.      Assessment/Plan Obstructive sleep apnea, BMI 24.00  To OR for sleep endoscopy.  Melida Quitter, MD 08/07/2022, 8:33 AM

## 2022-08-07 NOTE — Anesthesia Postprocedure Evaluation (Signed)
Anesthesia Post Note  Patient: Ashlee Robbins  Procedure(s) Performed: DRUG INDUCED ENDOSCOPY (Bilateral: Nose)     Patient location during evaluation: PACU Anesthesia Type: MAC Level of consciousness: awake Pain management: pain level controlled Vital Signs Assessment: post-procedure vital signs reviewed and stable Respiratory status: spontaneous breathing, nonlabored ventilation, respiratory function stable and patient connected to nasal cannula oxygen Cardiovascular status: stable and blood pressure returned to baseline Postop Assessment: no apparent nausea or vomiting Anesthetic complications: no   No notable events documented.  Last Vitals:  Vitals:   08/07/22 0937 08/07/22 0946  BP: (!) 127/93 (!) 141/99  Pulse: 64 (!) 58  Resp: (!) 21 (!) 22  Temp:  36.6 C  SpO2: 97% 98%    Last Pain:  Vitals:   08/07/22 0946  TempSrc: Oral  PainSc: 0-No pain                 Xiao Graul P Rayden Dock

## 2022-08-07 NOTE — Op Note (Signed)
Preop diagnosis: Obstructive sleep apnea Postop diagnosis: same Procedure: Drug-induced sleep endoscopy Surgeon: Redmond Baseman Anesth: IV sedation Compl: None Findings: There is 100% anterior-posterior collapse at the velum making her a good candidate for hypoglossal nerve stimulator placement.  There was also marked anterior-posterior collapse at the tongue base. Description:  After discussing risks, benefits, and alternatives, the patient was brought to the operative suite and placed on the operative table in the supine position.  Anesthesia was induced and the patient was given light sedation to simulate natural sleep. When the proper level was reached, an Afrin-soaked pledget was placed in the right nasal passage for a couple of minutes and then removed.  The fiberoptic laryngoscope was then passed to view the pharynx and larynx.  Findings are noted above and the exam was recorded.  After completion, the scope was removed and the patient was returned to anesthesia for wakeup and was moved to the recovery room in stable condition.

## 2022-08-07 NOTE — Anesthesia Preprocedure Evaluation (Addendum)
Anesthesia Evaluation  Patient identified by MRN, date of birth, ID band Patient awake    Reviewed: Allergy & Precautions, NPO status , Patient's Chart, lab work & pertinent test results  Airway Mallampati: III  TM Distance: >3 FB Neck ROM: Full    Dental no notable dental hx.    Pulmonary sleep apnea ,    Pulmonary exam normal        Cardiovascular hypertension, Pt. on medications Normal cardiovascular exam     Neuro/Psych PSYCHIATRIC DISORDERS Anxiety  Neuromuscular disease    GI/Hepatic negative GI ROS, Neg liver ROS,   Endo/Other  negative endocrine ROS  Renal/GU negative Renal ROS     Musculoskeletal negative musculoskeletal ROS (+)   Abdominal   Peds  Hematology negative hematology ROS (+)   Anesthesia Other Findings Obstructive Sleep Apnea  BMI 24.0-24.9, adult  Reproductive/Obstetrics                            Anesthesia Physical Anesthesia Plan  ASA: 2  Anesthesia Plan: MAC   Post-op Pain Management:    Induction: Intravenous  PONV Risk Score and Plan: 2 and Propofol infusion and Treatment may vary due to age or medical condition  Airway Management Planned: Natural Airway  Additional Equipment:   Intra-op Plan:   Post-operative Plan:   Informed Consent: I have reviewed the patients History and Physical, chart, labs and discussed the procedure including the risks, benefits and alternatives for the proposed anesthesia with the patient or authorized representative who has indicated his/her understanding and acceptance.     Dental advisory given  Plan Discussed with: CRNA  Anesthesia Plan Comments:        Anesthesia Quick Evaluation

## 2022-08-07 NOTE — Transfer of Care (Signed)
Immediate Anesthesia Transfer of Care Note  Patient: Maicee Ullman  Procedure(s) Performed: DRUG INDUCED ENDOSCOPY (Bilateral: Nose)  Patient Location: PACU  Anesthesia Type:MAC  Level of Consciousness: drowsy  Airway & Oxygen Therapy: Patient Spontanous Breathing and Patient connected to face mask oxygen  Post-op Assessment: Report given to RN and Post -op Vital signs reviewed and stable  Post vital signs: Reviewed and stable  Last Vitals:  Vitals Value Taken Time  BP 124/83 08/07/22 0925  Temp    Pulse 60 08/07/22 0926  Resp 14 08/07/22 0926  SpO2 98 % 08/07/22 0926  Vitals shown include unvalidated device data.  Last Pain:  Vitals:   08/07/22 0824  TempSrc: Oral  PainSc: 0-No pain      Patients Stated Pain Goal: 3 (61/68/37 2902)  Complications: No notable events documented.

## 2022-08-08 ENCOUNTER — Encounter (HOSPITAL_BASED_OUTPATIENT_CLINIC_OR_DEPARTMENT_OTHER): Payer: Self-pay | Admitting: Otolaryngology

## 2022-08-12 ENCOUNTER — Ambulatory Visit
Admission: RE | Admit: 2022-08-12 | Discharge: 2022-08-12 | Disposition: A | Discharge status: Home / Self Care | Payer: BC Managed Care – PPO | Source: Ambulatory Visit | Attending: Hematology and Oncology | Admitting: Hematology and Oncology

## 2022-08-12 DIAGNOSIS — Z9189 Other specified personal risk factors, not elsewhere classified: Secondary | ICD-10-CM

## 2022-08-12 MED ORDER — GADOPICLENOL 0.5 MMOL/ML IV SOLN
7.0000 mL | Freq: Once | INTRAVENOUS | Status: AC | PRN
  Administered 2022-08-12: 7 mL via INTRAVENOUS

## 2022-08-13 ENCOUNTER — Telehealth: Payer: Self-pay | Admitting: Hematology and Oncology

## 2022-08-13 NOTE — Telephone Encounter (Signed)
I informed the patient of the breast MRI was normal 

## 2022-08-15 ENCOUNTER — Other Ambulatory Visit: Payer: Self-pay | Admitting: Otolaryngology

## 2022-09-10 ENCOUNTER — Other Ambulatory Visit: Payer: Self-pay

## 2022-09-10 ENCOUNTER — Encounter (HOSPITAL_BASED_OUTPATIENT_CLINIC_OR_DEPARTMENT_OTHER): Payer: Self-pay | Admitting: Otolaryngology

## 2022-09-16 NOTE — Progress Notes (Signed)

## 2022-09-17 NOTE — Anesthesia Preprocedure Evaluation (Signed)
Anesthesia Evaluation  Patient identified by MRN, date of birth, ID band Patient awake    Reviewed: Allergy & Precautions, NPO status , Patient's Chart, lab work & pertinent test results  Airway Mallampati: II  TM Distance: >3 FB Neck ROM: Full    Dental no notable dental hx. (+) Teeth Intact, Dental Advisory Given   Pulmonary sleep apnea    Pulmonary exam normal breath sounds clear to auscultation       Cardiovascular hypertension, Normal cardiovascular exam Rhythm:Regular Rate:Normal     Neuro/Psych   Anxiety      Neuromuscular disease    GI/Hepatic Neg liver ROS,,,  Endo/Other    Renal/GU negative Renal ROS     Musculoskeletal negative musculoskeletal ROS (+)    Abdominal   Peds  Hematology   Anesthesia Other Findings nka  Reproductive/Obstetrics                             Anesthesia Physical Anesthesia Plan  ASA: 2  Anesthesia Plan: General   Post-op Pain Management: Minimal or no pain anticipated   Induction: Intravenous  PONV Risk Score and Plan: 4 or greater and Treatment may vary due to age or medical condition, Ondansetron, Midazolam and Dexamethasone  Airway Management Planned: Oral ETT  Additional Equipment: None  Intra-op Plan:   Post-operative Plan: Extubation in OR  Informed Consent: I have reviewed the patients History and Physical, chart, labs and discussed the procedure including the risks, benefits and alternatives for the proposed anesthesia with the patient or authorized representative who has indicated his/her understanding and acceptance.     Dental advisory given  Plan Discussed with:   Anesthesia Plan Comments:         Anesthesia Quick Evaluation

## 2022-09-18 ENCOUNTER — Ambulatory Visit (HOSPITAL_BASED_OUTPATIENT_CLINIC_OR_DEPARTMENT_OTHER): Payer: BC Managed Care – PPO | Admitting: Anesthesiology

## 2022-09-18 ENCOUNTER — Encounter (HOSPITAL_BASED_OUTPATIENT_CLINIC_OR_DEPARTMENT_OTHER)
Admission: RE | Disposition: A | Discharge status: Home / Self Care | Payer: Self-pay | Source: Home / Self Care | Attending: Otolaryngology

## 2022-09-18 ENCOUNTER — Encounter (HOSPITAL_BASED_OUTPATIENT_CLINIC_OR_DEPARTMENT_OTHER): Payer: Self-pay | Admitting: Otolaryngology

## 2022-09-18 ENCOUNTER — Ambulatory Visit (HOSPITAL_COMMUNITY): Payer: BC Managed Care – PPO

## 2022-09-18 ENCOUNTER — Other Ambulatory Visit: Payer: Self-pay

## 2022-09-18 ENCOUNTER — Ambulatory Visit (HOSPITAL_BASED_OUTPATIENT_CLINIC_OR_DEPARTMENT_OTHER)
Admission: RE | Admit: 2022-09-18 | Discharge: 2022-09-18 | Disposition: A | Discharge status: Home / Self Care | Payer: BC Managed Care – PPO | Attending: Otolaryngology | Admitting: Otolaryngology

## 2022-09-18 DIAGNOSIS — Z789 Other specified health status: Secondary | ICD-10-CM | POA: Insufficient documentation

## 2022-09-18 DIAGNOSIS — I10 Essential (primary) hypertension: Secondary | ICD-10-CM | POA: Diagnosis not present

## 2022-09-18 DIAGNOSIS — Z6823 Body mass index (BMI) 23.0-23.9, adult: Secondary | ICD-10-CM | POA: Diagnosis not present

## 2022-09-18 DIAGNOSIS — F419 Anxiety disorder, unspecified: Secondary | ICD-10-CM | POA: Diagnosis not present

## 2022-09-18 DIAGNOSIS — G4733 Obstructive sleep apnea (adult) (pediatric): Secondary | ICD-10-CM | POA: Insufficient documentation

## 2022-09-18 DIAGNOSIS — Z01818 Encounter for other preprocedural examination: Secondary | ICD-10-CM

## 2022-09-18 HISTORY — PX: IMPLANTATION OF HYPOGLOSSAL NERVE STIMULATOR: SHX6827

## 2022-09-18 SURGERY — INSERTION, HYPOGLOSSAL NERVE STIMULATOR
Anesthesia: General | Site: Neck | Laterality: Right

## 2022-09-18 MED ORDER — CEFAZOLIN SODIUM-DEXTROSE 2-4 GM/100ML-% IV SOLN
2.0000 g | INTRAVENOUS | Status: AC
  Administered 2022-09-18: 2 g via INTRAVENOUS

## 2022-09-18 MED ORDER — PROPOFOL 500 MG/50ML IV EMUL
INTRAVENOUS | Status: AC
  Filled 2022-09-18: qty 100

## 2022-09-18 MED ORDER — ONDANSETRON HCL 4 MG/2ML IJ SOLN
INTRAMUSCULAR | Status: DC | PRN
  Administered 2022-09-18: 4 mg via INTRAVENOUS

## 2022-09-18 MED ORDER — SUCCINYLCHOLINE CHLORIDE 200 MG/10ML IV SOSY
PREFILLED_SYRINGE | INTRAVENOUS | Status: DC | PRN
  Administered 2022-09-18: 100 mg via INTRAVENOUS

## 2022-09-18 MED ORDER — 0.9 % SODIUM CHLORIDE (POUR BTL) OPTIME
TOPICAL | Status: DC | PRN
  Administered 2022-09-18: 75 mL

## 2022-09-18 MED ORDER — DEXAMETHASONE SODIUM PHOSPHATE 10 MG/ML IJ SOLN
INTRAMUSCULAR | Status: AC
  Filled 2022-09-18: qty 1

## 2022-09-18 MED ORDER — LIDOCAINE HCL (CARDIAC) PF 100 MG/5ML IV SOSY
PREFILLED_SYRINGE | INTRAVENOUS | Status: DC | PRN
  Administered 2022-09-18: 100 mg via INTRAVENOUS

## 2022-09-18 MED ORDER — PHENYLEPHRINE HCL-NACL 20-0.9 MG/250ML-% IV SOLN
INTRAVENOUS | Status: DC | PRN
  Administered 2022-09-18: 40 ug/min via INTRAVENOUS

## 2022-09-18 MED ORDER — ACETAMINOPHEN 10 MG/ML IV SOLN: 1000.0000 mg | Freq: Once | INTRAVENOUS | Status: DC | PRN

## 2022-09-18 MED ORDER — PHENYLEPHRINE HCL (PRESSORS) 10 MG/ML IV SOLN
INTRAVENOUS | Status: AC
  Filled 2022-09-18: qty 1

## 2022-09-18 MED ORDER — FENTANYL CITRATE (PF) 100 MCG/2ML IJ SOLN
INTRAMUSCULAR | Status: DC | PRN
  Administered 2022-09-18 (×2): 100 ug via INTRAVENOUS

## 2022-09-18 MED ORDER — PROPOFOL 500 MG/50ML IV EMUL
INTRAVENOUS | Status: DC | PRN
  Administered 2022-09-18: 50 ug/kg/min via INTRAVENOUS

## 2022-09-18 MED ORDER — PROPOFOL 10 MG/ML IV BOLUS
INTRAVENOUS | Status: AC
  Filled 2022-09-18: qty 20

## 2022-09-18 MED ORDER — ONDANSETRON HCL 4 MG/2ML IJ SOLN
INTRAMUSCULAR | Status: AC
  Filled 2022-09-18: qty 2

## 2022-09-18 MED ORDER — GLYCOPYRROLATE PF 0.2 MG/ML IJ SOSY
PREFILLED_SYRINGE | INTRAMUSCULAR | Status: AC
  Filled 2022-09-18: qty 2

## 2022-09-18 MED ORDER — SUCCINYLCHOLINE CHLORIDE 200 MG/10ML IV SOSY
PREFILLED_SYRINGE | INTRAVENOUS | Status: AC
  Filled 2022-09-18: qty 10

## 2022-09-18 MED ORDER — GLYCOPYRROLATE 0.2 MG/ML IJ SOLN
INTRAMUSCULAR | Status: DC | PRN
  Administered 2022-09-18: .2 mg via INTRAVENOUS

## 2022-09-18 MED ORDER — FENTANYL CITRATE (PF) 100 MCG/2ML IJ SOLN: 25.0000 ug | INTRAMUSCULAR | Status: DC | PRN

## 2022-09-18 MED ORDER — DEXAMETHASONE SODIUM PHOSPHATE 4 MG/ML IJ SOLN
INTRAMUSCULAR | Status: DC | PRN
  Administered 2022-09-18: 10 mg via INTRAVENOUS

## 2022-09-18 MED ORDER — FENTANYL CITRATE (PF) 100 MCG/2ML IJ SOLN
INTRAMUSCULAR | Status: AC
  Filled 2022-09-18: qty 2

## 2022-09-18 MED ORDER — DEXMEDETOMIDINE HCL IN NACL 200 MCG/50ML IV SOLN: INTRAVENOUS | Status: DC | PRN

## 2022-09-18 MED ORDER — EPHEDRINE 5 MG/ML INJ
INTRAVENOUS | Status: AC
  Filled 2022-09-18: qty 5

## 2022-09-18 MED ORDER — DEXMEDETOMIDINE HCL IN NACL 80 MCG/20ML IV SOLN
INTRAVENOUS | Status: AC
  Filled 2022-09-18: qty 20

## 2022-09-18 MED ORDER — HYDROCODONE-ACETAMINOPHEN 5-325 MG PO TABS: 1.0000 | ORAL_TABLET | Freq: Four times a day (QID) | ORAL | 0 refills | Status: DC | PRN

## 2022-09-18 MED ORDER — PROPOFOL 10 MG/ML IV BOLUS
INTRAVENOUS | Status: DC | PRN
  Administered 2022-09-18: 50 mg via INTRAVENOUS
  Administered 2022-09-18: 120 mg via INTRAVENOUS

## 2022-09-18 MED ORDER — ONDANSETRON HCL 4 MG/2ML IJ SOLN: 4.0000 mg | Freq: Once | INTRAMUSCULAR | Status: DC | PRN

## 2022-09-18 MED ORDER — LIDOCAINE-EPINEPHRINE 1 %-1:100000 IJ SOLN
INTRAMUSCULAR | Status: DC | PRN
  Administered 2022-09-18: 5 mL

## 2022-09-18 MED ORDER — LACTATED RINGERS IV SOLN: INTRAVENOUS | Status: DC

## 2022-09-18 MED ORDER — EPHEDRINE SULFATE (PRESSORS) 50 MG/ML IJ SOLN
INTRAMUSCULAR | Status: DC | PRN
  Administered 2022-09-18: 5 mg via INTRAVENOUS
  Administered 2022-09-18: 10 mg via INTRAVENOUS

## 2022-09-18 MED ORDER — DEXMEDETOMIDINE HCL IN NACL 80 MCG/20ML IV SOLN
INTRAVENOUS | Status: DC | PRN
  Administered 2022-09-18: 16 ug via BUCCAL

## 2022-09-18 MED ORDER — CEFAZOLIN SODIUM-DEXTROSE 2-4 GM/100ML-% IV SOLN
INTRAVENOUS | Status: AC
  Filled 2022-09-18: qty 100

## 2022-09-18 MED ORDER — LIDOCAINE 2% (20 MG/ML) 5 ML SYRINGE
INTRAMUSCULAR | Status: AC
  Filled 2022-09-18: qty 5

## 2022-09-18 SURGICAL SUPPLY — 68 items
ACC NRSTM 4 TRQ WRNCH STRL (MISCELLANEOUS)
ADH SKN CLS APL DERMABOND .7 (GAUZE/BANDAGES/DRESSINGS) ×2
BLADE CLIPPER SURG (BLADE) IMPLANT
BLADE SURG 15 STRL LF DISP TIS (BLADE) ×1 IMPLANT
BLADE SURG 15 STRL SS (BLADE) ×1
CANISTER SUCT 1200ML W/VALVE (MISCELLANEOUS) ×1 IMPLANT
CORD BIPOLAR FORCEPS 12FT (ELECTRODE) ×1 IMPLANT
COVER PROBE CYLINDRICAL 5X96 (MISCELLANEOUS) ×1 IMPLANT
DERMABOND ADVANCED .7 DNX12 (GAUZE/BANDAGES/DRESSINGS) ×2 IMPLANT
DRAPE C-ARM 35X43 STRL (DRAPES) ×1 IMPLANT
DRAPE HEAD BAR (DRAPES) IMPLANT
DRAPE INCISE IOBAN 66X45 STRL (DRAPES) ×1 IMPLANT
DRAPE MICROSCOPE WILD 40.5X102 (DRAPES) ×1 IMPLANT
DRAPE UTILITY XL STRL (DRAPES) ×1 IMPLANT
DRSG TEGADERM 2-3/8X2-3/4 SM (GAUZE/BANDAGES/DRESSINGS) ×2 IMPLANT
DRSG TEGADERM 4X4.75 (GAUZE/BANDAGES/DRESSINGS) IMPLANT
ELECT COATED BLADE 2.86 ST (ELECTRODE) ×1 IMPLANT
ELECT EMG 18 NIMS (NEUROSURGERY SUPPLIES) ×1
ELECT REM PT RETURN 9FT ADLT (ELECTROSURGICAL) ×1
ELECTRODE EMG 18 NIMS (NEUROSURGERY SUPPLIES) ×1 IMPLANT
ELECTRODE REM PT RTRN 9FT ADLT (ELECTROSURGICAL) ×1 IMPLANT
FORCEPS BIPOLAR SPETZLER 8 1.0 (NEUROSURGERY SUPPLIES) ×1 IMPLANT
GAUZE 4X4 16PLY ~~LOC~~+RFID DBL (SPONGE) ×1 IMPLANT
GAUZE SPONGE 4X4 12PLY STRL (GAUZE/BANDAGES/DRESSINGS) ×1 IMPLANT
GENERATOR PULSE INSPIRE (Generator) ×1 IMPLANT
GENERATOR PULSE INSPIRE IV (Generator) ×1 IMPLANT
GLOVE BIO SURGEON STRL SZ 6.5 (GLOVE) IMPLANT
GLOVE BIO SURGEON STRL SZ7.5 (GLOVE) ×1 IMPLANT
GLOVE BIOGEL PI IND STRL 7.0 (GLOVE) IMPLANT
GLOVE ECLIPSE 6.5 STRL STRAW (GLOVE) IMPLANT
GLOVE SURG SS PI 6.5 STRL IVOR (GLOVE) IMPLANT
GOWN STRL REUS W/ TWL LRG LVL3 (GOWN DISPOSABLE) ×3 IMPLANT
GOWN STRL REUS W/TWL LRG LVL3 (GOWN DISPOSABLE) ×3
IV CATH 18G SAFETY (IV SOLUTION) ×1 IMPLANT
KIT NEURO ACCESSORY W/WRENCH (MISCELLANEOUS) IMPLANT
LEAD SENSING RESP INSPIRE (Lead) ×1 IMPLANT
LEAD SENSING RESP INSPIRE IV (Lead) ×1 IMPLANT
LEAD SLEEP STIM INSPIRE IV/V (Lead) ×1 IMPLANT
LEAD SLEEP STIMULATION INSPIRE (Lead) ×1 IMPLANT
LOOP VASCULAR MINI 18 RED (MISCELLANEOUS) ×1
LOOP VESSEL MAXI BLUE (MISCELLANEOUS) ×1 IMPLANT
MARKER SKIN DUAL TIP RULER LAB (MISCELLANEOUS) ×1 IMPLANT
NDL HYPO 25X1 1.5 SAFETY (NEEDLE) ×1 IMPLANT
NEEDLE HYPO 25X1 1.5 SAFETY (NEEDLE) ×1 IMPLANT
NS IRRIG 1000ML POUR BTL (IV SOLUTION) ×1 IMPLANT
PACK BASIN DAY SURGERY FS (CUSTOM PROCEDURE TRAY) ×1 IMPLANT
PACK ENT DAY SURGERY (CUSTOM PROCEDURE TRAY) ×1 IMPLANT
PASSER CATH 36 CODMAN DISP (NEUROSURGERY SUPPLIES) IMPLANT
PASSER CATH 38CM DISP (INSTRUMENTS) IMPLANT
PENCIL SMOKE EVACUATOR (MISCELLANEOUS) ×1 IMPLANT
PROBE NERVE STIMULATOR (NEUROSURGERY SUPPLIES) ×1 IMPLANT
REMOTE CONTROL SLEEP INSPIRE (MISCELLANEOUS) ×1 IMPLANT
SET WALTER ACTIVATION W/DRAPE (SET/KITS/TRAYS/PACK) ×1 IMPLANT
SLEEVE SCD COMPRESS KNEE MED (STOCKING) ×1 IMPLANT
SPONGE INTESTINAL PEANUT (DISPOSABLE) ×1 IMPLANT
SUT SILK 2 0 SH (SUTURE) ×1 IMPLANT
SUT SILK 3 0 REEL (SUTURE) ×1 IMPLANT
SUT SILK 3 0 SH 30 (SUTURE) ×1 IMPLANT
SUT SILK 3-0 (SUTURE) ×1
SUT SILK 3-0 RB1 30XBRD (SUTURE) ×1
SUT VIC AB 3-0 SH 27 (SUTURE) ×2
SUT VIC AB 3-0 SH 27X BRD (SUTURE) ×1 IMPLANT
SUT VIC AB 4-0 PS2 27 (SUTURE) ×1 IMPLANT
SUTURE SILK 3-0 RB1 30XBRD (SUTURE) ×2 IMPLANT
SYR 10ML LL (SYRINGE) ×1 IMPLANT
SYR BULB EAR ULCER 3OZ GRN STR (SYRINGE) ×1 IMPLANT
TOWEL GREEN STERILE FF (TOWEL DISPOSABLE) ×2 IMPLANT
VASCULAR TIE MINI RED 18IN STL (MISCELLANEOUS) ×1 IMPLANT

## 2022-09-18 NOTE — H&P (Signed)
Ashlee Robbins is an 61 y.o. female.   Chief Complaint: Sleep apnea HPI: 61 year old female with obstructive sleep apnea who has been unable to tolerate CPAP.  Past Medical History:  Diagnosis Date   Breast cancer screening, high risk patient 12/09/2012   Hx of cervical cancer    Hypertension    Sleep apnea     Past Surgical History:  Procedure Laterality Date   DRUG INDUCED ENDOSCOPY Bilateral 08/07/2022   Procedure: DRUG INDUCED ENDOSCOPY;  Surgeon: Christia Reading, MD;  Location: [facility];  Service: ENT;  Laterality: Bilateral;   REPLACEMENT TOTAL KNEE     SHOULDER OPEN ROTATOR CUFF REPAIR Right 01/31/2022   TUBAL LIGATION      Family History  Problem Relation Age of Onset   Cancer Mother        THYROID   Breast cancer Mother 10   Diabetes Father    Thyroid cancer Maternal Aunt    Breast cancer Maternal Grandmother    Cancer Brother        THYROID   Hypertension Brother    Social History:  reports that she has never smoked. She has never used smokeless tobacco. She reports current alcohol use. She reports that she does not use drugs.  Allergies: No Known Allergies  Medications Prior to Admission  Medication Sig Dispense Refill   amLODipine (NORVASC) 5 MG tablet      losartan (COZAAR) 100 MG tablet Take 1 tablet (100 mg total) by mouth daily.     sertraline (ZOLOFT) 50 MG tablet Take 50 mg by mouth daily.      No results found for this or any previous visit (from the past 48 hour(s)). No results found.  Review of Systems  All other systems reviewed and are negative.   Blood pressure (!) 137/101, pulse 74, temperature 97.7 F (36.5 C), temperature source Oral, resp. rate 17, height 5\' 7"  (1.702 m), weight 68.8 kg, SpO2 98 %. Physical Exam Constitutional:      Appearance: Normal appearance. She is normal weight.  HENT:     Head: Normocephalic and atraumatic.     Right Ear: External ear normal.     Left Ear: External ear normal.     Nose: Nose  normal.     Mouth/Throat:     Mouth: Mucous membranes are moist.     Pharynx: Oropharynx is clear.  Eyes:     Extraocular Movements: Extraocular movements intact.     Conjunctiva/sclera: Conjunctivae normal.     Pupils: Pupils are equal, round, and reactive to light.  Cardiovascular:     Rate and Rhythm: Normal rate.  Pulmonary:     Effort: Pulmonary effort is normal.  Musculoskeletal:     Cervical back: Normal range of motion.  Skin:    General: Skin is warm and dry.  Neurological:     General: No focal deficit present.     Mental Status: She is alert and oriented to person, place, and time.  Psychiatric:        Mood and Affect: Mood normal.        Behavior: Behavior normal.        Thought Content: Thought content normal.        Judgment: Judgment normal.      Assessment/Plan Obstructive sleep apnea and BMI 23.76  To OR for hypoglossal nerve stimulator placement.  , MD 09/18/2022, 1:11 PM

## 2022-09-18 NOTE — Anesthesia Postprocedure Evaluation (Signed)
Anesthesia Post Note  Patient: Ashlee Robbins  Procedure(s) Performed: IMPLANTATION OF HYPOGLOSSAL NERVE STIMULATOR (Right: Neck)     Patient location during evaluation: PACU Anesthesia Type: General Level of consciousness: awake and alert Pain management: pain level controlled Vital Signs Assessment: post-procedure vital signs reviewed and stable Respiratory status: spontaneous breathing, nonlabored ventilation, respiratory function stable and patient connected to nasal cannula oxygen Cardiovascular status: blood pressure returned to baseline and stable Postop Assessment: no apparent nausea or vomiting Anesthetic complications: no  No notable events documented.  Last Vitals:  Vitals:   09/18/22 1600 09/18/22 1610  BP: 119/86 (!) 121/91  Pulse: 79   Resp: 14 16  Temp:  36.7 C  SpO2: 95% 96%    Last Pain:  Vitals:   09/18/22 1610  TempSrc:   PainSc: 2                  Trevor Iha

## 2022-09-18 NOTE — Op Note (Signed)

## 2022-09-18 NOTE — Discharge Instructions (Signed)

## 2022-09-18 NOTE — Brief Op Note (Signed)
09/18/2022  3:07 PM  PATIENT:  Ashlee Robbins  61 y.o. female  PRE-OPERATIVE DIAGNOSIS:  Obstructive Sleep Apnea BMI 24.0-24.9, adult  POST-OPERATIVE DIAGNOSIS:  Obstructive Sleep Apnea  PROCEDURE:  Procedure(s): IMPLANTATION OF HYPOGLOSSAL NERVE STIMULATOR (Right)  SURGEON:  Surgeon(s) and Role:    Christia Reading, MD - Primary  PHYSICIAN ASSISTANT:   ASSISTANTS: none   ANESTHESIA:   general  EBL:  17 mL   BLOOD ADMINISTERED:none  DRAINS: none   LOCAL MEDICATIONS USED:  LIDOCAINE   SPECIMEN:  No Specimen  DISPOSITION OF SPECIMEN:  N/A  COUNTS:  YES  TOURNIQUET:  * No tourniquets in log *  DICTATION: .Note written in EPIC  PLAN OF CARE: Discharge to home after PACU  PATIENT DISPOSITION:  PACU - hemodynamically stable.   Delay start of Pharmacological VTE agent (>24hrs) due to surgical blood loss or risk of bleeding: no

## 2022-09-18 NOTE — Anesthesia Procedure Notes (Signed)
Procedure Name: Intubation Date/Time: 09/18/2022 1:57 PM  Performed by: Verita Lamb, CRNAPre-anesthesia Checklist: Patient identified, Emergency Drugs available, Suction available and Patient being monitored Patient Re-evaluated:Patient Re-evaluated prior to induction Oxygen Delivery Method: Circle system utilized Preoxygenation: Pre-oxygenation with 100% oxygen Induction Type: IV induction Ventilation: Mask ventilation without difficulty Laryngoscope Size: Mac and 4 Grade View: Grade I Tube type: Oral Tube size: 7.5 mm Number of attempts: 1 Airway Equipment and Method: Stylet and Oral airway Placement Confirmation: ETT inserted through vocal cords under direct vision, positive ETCO2, breath sounds checked- equal and bilateral and CO2 detector Secured at: 23 cm Tube secured with: Tape Dental Injury: Teeth and Oropharynx as per pre-operative assessment

## 2022-09-18 NOTE — Transfer of Care (Signed)
Immediate Anesthesia Transfer of Care Note  Patient: Ashlee Robbins  Procedure(s) Performed: IMPLANTATION OF HYPOGLOSSAL NERVE STIMULATOR (Right: Neck)  Patient Location: PACU  Anesthesia Type:General  Level of Consciousness: awake, alert , oriented, drowsy, and patient cooperative  Airway & Oxygen Therapy: Patient Spontanous Breathing and Patient connected to face mask oxygen  Post-op Assessment: Report given to RN and Post -op Vital signs reviewed and stable  Post vital signs: Reviewed and stable  Last Vitals:  Vitals Value Taken Time  BP    Temp    Pulse    Resp    SpO2      Last Pain:  Vitals:   09/18/22 1121  TempSrc: Oral  PainSc: 0-No pain      Patients Stated Pain Goal: 2 (09/18/22 1121)  Complications: No notable events documented.

## 2022-09-19 ENCOUNTER — Encounter (HOSPITAL_BASED_OUTPATIENT_CLINIC_OR_DEPARTMENT_OTHER): Payer: Self-pay | Admitting: Otolaryngology

## 2023-02-24 ENCOUNTER — Other Ambulatory Visit: Payer: Self-pay | Admitting: Otolaryngology

## 2023-03-18 ENCOUNTER — Encounter (HOSPITAL_COMMUNITY): Payer: Self-pay | Admitting: Otolaryngology

## 2023-03-18 NOTE — Progress Notes (Addendum)
PCP - Dr Sharion Settler Cardiologist - n/a Oncology - Dr Serena Croissant  Chest x-ray - 09/18/22 (1V) EKG - 02/25/23 CE (Requested Tracing) Stress Test - n/a ECHO - n/a Cardiac Cath - n/a  ICD Pacemaker/Loop - n/a  Sleep Study -  Yes CPAP - Inspired not working.  Not currently using CPAP but states she will use after this surgery.  Diabetes - n/a  ERAS: Clear liquids til 9 am DOS  STOP now taking any Aspirin (unless otherwise instructed by your surgeon), Aleve, Naproxen, Ibuprofen, Motrin, Advil, Goody's, BC's, all herbal medications, fish oil, and all vitamins.   Coronavirus Screening Do you have any of the following symptoms:  Cough yes/no: No Fever (>100.27F)  yes/no: No Runny nose yes/no: No Sore throat yes/no: No Difficulty breathing/shortness of breath  yes/no: No  Have you traveled in the last 14 days and where? yes/no: No  Patient verbalized understanding of instructions that were given via phone.

## 2023-03-19 ENCOUNTER — Encounter (HOSPITAL_COMMUNITY): Payer: Self-pay | Admitting: Otolaryngology

## 2023-03-19 ENCOUNTER — Ambulatory Visit (HOSPITAL_COMMUNITY): Payer: BC Managed Care – PPO

## 2023-03-19 ENCOUNTER — Other Ambulatory Visit: Payer: Self-pay

## 2023-03-19 ENCOUNTER — Ambulatory Visit (HOSPITAL_COMMUNITY): Payer: BC Managed Care – PPO | Admitting: Anesthesiology

## 2023-03-19 ENCOUNTER — Encounter (HOSPITAL_COMMUNITY)
Admission: RE | Disposition: A | Discharge status: Home / Self Care | Payer: Self-pay | Source: Home / Self Care | Attending: Otolaryngology

## 2023-03-19 ENCOUNTER — Ambulatory Visit (HOSPITAL_COMMUNITY)
Admission: RE | Admit: 2023-03-19 | Discharge: 2023-03-19 | Disposition: A | Discharge status: Home / Self Care | Payer: BC Managed Care – PPO | Attending: Otolaryngology | Admitting: Otolaryngology

## 2023-03-19 DIAGNOSIS — I10 Essential (primary) hypertension: Secondary | ICD-10-CM | POA: Diagnosis not present

## 2023-03-19 DIAGNOSIS — F419 Anxiety disorder, unspecified: Secondary | ICD-10-CM | POA: Diagnosis not present

## 2023-03-19 DIAGNOSIS — F909 Attention-deficit hyperactivity disorder, unspecified type: Secondary | ICD-10-CM | POA: Insufficient documentation

## 2023-03-19 DIAGNOSIS — G4733 Obstructive sleep apnea (adult) (pediatric): Secondary | ICD-10-CM | POA: Diagnosis present

## 2023-03-19 HISTORY — DX: Attention-deficit hyperactivity disorder, unspecified type: F90.9

## 2023-03-19 HISTORY — DX: Malignant (primary) neoplasm, unspecified: C80.1

## 2023-03-19 HISTORY — PX: IMPLANTATION OF HYPOGLOSSAL NERVE STIMULATOR: SHX6827

## 2023-03-19 HISTORY — DX: Anxiety disorder, unspecified: F41.9

## 2023-03-19 LAB — CBC
HCT: 38.8 % (ref 36.0–46.0)
Hemoglobin: 13.1 g/dL (ref 12.0–15.0)
MCH: 33.5 pg (ref 26.0–34.0)
MCHC: 33.8 g/dL (ref 30.0–36.0)
MCV: 99.2 fL (ref 80.0–100.0)
Platelets: 314 10*3/uL (ref 150–400)
RBC: 3.91 MIL/uL (ref 3.87–5.11)
RDW: 13.4 % (ref 11.5–15.5)
WBC: 4.1 10*3/uL (ref 4.0–10.5)
nRBC: 0 % (ref 0.0–0.2)

## 2023-03-19 LAB — BASIC METABOLIC PANEL
Anion gap: 9 (ref 5–15)
BUN: 22 mg/dL (ref 8–23)
CO2: 23 mmol/L (ref 22–32)
Calcium: 8.6 mg/dL — ABNORMAL LOW (ref 8.9–10.3)
Chloride: 106 mmol/L (ref 98–111)
Creatinine, Ser: 0.79 mg/dL (ref 0.44–1.00)
GFR, Estimated: >60 mL/min (ref >60)
Glucose, Bld: 79 mg/dL (ref 70–99)
Potassium: 4.1 mmol/L (ref 3.5–5.1)
Sodium: 138 mmol/L (ref 135–145)

## 2023-03-19 SURGERY — INSERTION, HYPOGLOSSAL NERVE STIMULATOR
Anesthesia: General | Site: Chest | Laterality: Right

## 2023-03-19 MED ORDER — HYDROCODONE-ACETAMINOPHEN 5-325 MG PO TABS: 1.0000 | ORAL_TABLET | Freq: Four times a day (QID) | ORAL | 0 refills | Status: DC | PRN

## 2023-03-19 MED ORDER — FENTANYL CITRATE (PF) 250 MCG/5ML IJ SOLN
INTRAMUSCULAR | Status: AC
  Filled 2023-03-19: qty 5

## 2023-03-19 MED ORDER — ROCURONIUM BROMIDE 10 MG/ML (PF) SYRINGE
PREFILLED_SYRINGE | INTRAVENOUS | Status: DC | PRN
  Administered 2023-03-19: 50 mg via INTRAVENOUS

## 2023-03-19 MED ORDER — MIDAZOLAM HCL 2 MG/2ML IJ SOLN
INTRAMUSCULAR | Status: AC
  Filled 2023-03-19: qty 2

## 2023-03-19 MED ORDER — CHLORHEXIDINE GLUCONATE 0.12 % MT SOLN
OROMUCOSAL | Status: AC
  Filled 2023-03-19: qty 15

## 2023-03-19 MED ORDER — 0.9 % SODIUM CHLORIDE (POUR BTL) OPTIME
TOPICAL | Status: DC | PRN
  Administered 2023-03-19: 1000 mL

## 2023-03-19 MED ORDER — LIDOCAINE-EPINEPHRINE 1 %-1:100000 IJ SOLN
INTRAMUSCULAR | Status: AC
  Filled 2023-03-19: qty 1

## 2023-03-19 MED ORDER — CEFAZOLIN SODIUM-DEXTROSE 2-4 GM/100ML-% IV SOLN
2.0000 g | INTRAVENOUS | Status: AC
  Administered 2023-03-19: 2 g via INTRAVENOUS

## 2023-03-19 MED ORDER — LIDOCAINE-EPINEPHRINE 1 %-1:100000 IJ SOLN
INTRAMUSCULAR | Status: DC | PRN
  Administered 2023-03-19: 3 mL

## 2023-03-19 MED ORDER — ACETAMINOPHEN 500 MG PO TABS
ORAL_TABLET | ORAL | Status: AC
  Filled 2023-03-19: qty 2

## 2023-03-19 MED ORDER — DEXAMETHASONE SODIUM PHOSPHATE 10 MG/ML IJ SOLN
INTRAMUSCULAR | Status: AC
  Filled 2023-03-19: qty 1

## 2023-03-19 MED ORDER — ONDANSETRON HCL 4 MG/2ML IJ SOLN
INTRAMUSCULAR | Status: AC
  Filled 2023-03-19: qty 2

## 2023-03-19 MED ORDER — SUGAMMADEX SODIUM 200 MG/2ML IV SOLN
INTRAVENOUS | Status: DC | PRN
  Administered 2023-03-19: 200 mg via INTRAVENOUS

## 2023-03-19 MED ORDER — ONDANSETRON HCL 4 MG/2ML IJ SOLN
INTRAMUSCULAR | Status: DC | PRN
  Administered 2023-03-19: 4 mg via INTRAVENOUS

## 2023-03-19 MED ORDER — PROPOFOL 10 MG/ML IV BOLUS
INTRAVENOUS | Status: AC
  Filled 2023-03-19: qty 20

## 2023-03-19 MED ORDER — MIDAZOLAM HCL 2 MG/2ML IJ SOLN
INTRAMUSCULAR | Status: DC | PRN
  Administered 2023-03-19: 2 mg via INTRAVENOUS

## 2023-03-19 MED ORDER — DEXAMETHASONE SODIUM PHOSPHATE 10 MG/ML IJ SOLN
INTRAMUSCULAR | Status: DC | PRN
  Administered 2023-03-19: 10 mg via INTRAVENOUS

## 2023-03-19 MED ORDER — LACTATED RINGERS IV SOLN: INTRAVENOUS | Status: DC

## 2023-03-19 MED ORDER — LIDOCAINE 2% (20 MG/ML) 5 ML SYRINGE
INTRAMUSCULAR | Status: DC | PRN
  Administered 2023-03-19: 60 mg via INTRAVENOUS

## 2023-03-19 MED ORDER — OXYCODONE HCL 5 MG/5ML PO SOLN: 5.0000 mg | Freq: Once | ORAL | Status: DC | PRN

## 2023-03-19 MED ORDER — OXYCODONE HCL 5 MG PO TABS: 5.0000 mg | ORAL_TABLET | Freq: Once | ORAL | Status: DC | PRN

## 2023-03-19 MED ORDER — ROCURONIUM BROMIDE 10 MG/ML (PF) SYRINGE
PREFILLED_SYRINGE | INTRAVENOUS | Status: AC
  Filled 2023-03-19: qty 10

## 2023-03-19 MED ORDER — STERILE WATER FOR IRRIGATION IR SOLN
Status: DC | PRN
  Administered 2023-03-19: 1

## 2023-03-19 MED ORDER — PROMETHAZINE HCL 25 MG/ML IJ SOLN: 6.2500 mg | INTRAMUSCULAR | Status: DC | PRN

## 2023-03-19 MED ORDER — ACETAMINOPHEN 500 MG PO TABS
1000.0000 mg | ORAL_TABLET | Freq: Once | ORAL | Status: AC
  Administered 2023-03-19: 1000 mg via ORAL

## 2023-03-19 MED ORDER — CHLORHEXIDINE GLUCONATE 0.12 % MT SOLN
15.0000 mL | Freq: Once | OROMUCOSAL | Status: AC
  Administered 2023-03-19: 15 mL via OROMUCOSAL

## 2023-03-19 MED ORDER — PHENYLEPHRINE HCL-NACL 20-0.9 MG/250ML-% IV SOLN
INTRAVENOUS | Status: DC | PRN
  Administered 2023-03-19: 30 ug/min via INTRAVENOUS

## 2023-03-19 MED ORDER — CEFAZOLIN SODIUM-DEXTROSE 2-4 GM/100ML-% IV SOLN
INTRAVENOUS | Status: AC
  Filled 2023-03-19: qty 100

## 2023-03-19 MED ORDER — PROPOFOL 10 MG/ML IV BOLUS
INTRAVENOUS | Status: DC | PRN
  Administered 2023-03-19: 130 mg via INTRAVENOUS

## 2023-03-19 MED ORDER — LIDOCAINE 2% (20 MG/ML) 5 ML SYRINGE
INTRAMUSCULAR | Status: AC
  Filled 2023-03-19: qty 5

## 2023-03-19 MED ORDER — FENTANYL CITRATE (PF) 100 MCG/2ML IJ SOLN: 25.0000 ug | INTRAMUSCULAR | Status: DC | PRN

## 2023-03-19 MED ORDER — FENTANYL CITRATE (PF) 250 MCG/5ML IJ SOLN
INTRAMUSCULAR | Status: DC | PRN
  Administered 2023-03-19 (×2): 50 ug via INTRAVENOUS

## 2023-03-19 MED ORDER — ORAL CARE MOUTH RINSE: 15.0000 mL | Freq: Once | OROMUCOSAL | Status: AC

## 2023-03-19 SURGICAL SUPPLY — 69 items
ACC NRSTM 4 TRQ WRNCH STRL (MISCELLANEOUS) ×1
ADH SKN CLS APL DERMABOND .7 (GAUZE/BANDAGES/DRESSINGS) ×1
BAG COUNTER SPONGE SURGICOUNT (BAG) ×1 IMPLANT
BAG SPNG CNTER NS LX DISP (BAG) ×1
BLADE CLIPPER SURG (BLADE) IMPLANT
BLADE SURG 15 STRL LF DISP TIS (BLADE) ×3 IMPLANT
BLADE SURG 15 STRL SS (BLADE) ×1
CANISTER SUCT 3000ML PPV (MISCELLANEOUS) ×1 IMPLANT
CORD BIPOLAR FORCEPS 12FT (ELECTRODE) ×1 IMPLANT
COVER PROBE W GEL 5X96 (DRAPES) ×1 IMPLANT
COVER SURGICAL LIGHT HANDLE (MISCELLANEOUS) ×1 IMPLANT
DERMABOND ADVANCED .7 DNX12 (GAUZE/BANDAGES/DRESSINGS) ×2 IMPLANT
DRAPE C-ARM 35X43 STRL (DRAPES) ×1 IMPLANT
DRAPE HEAD BAR (DRAPES) ×1 IMPLANT
DRAPE INCISE IOBAN 66X45 STRL (DRAPES) ×1 IMPLANT
DRAPE MICROSCOPE LEICA 54X105 (DRAPES) ×1 IMPLANT
DRAPE UTILITY XL STRL (DRAPES) ×1 IMPLANT
DRSG TEGADERM 4X4.75 (GAUZE/BANDAGES/DRESSINGS) ×3 IMPLANT
ELECT COATED BLADE 2.86 ST (ELECTRODE) ×1 IMPLANT
ELECT EMG 18 NIMS (NEUROSURGERY SUPPLIES) ×1
ELECT REM PT RETURN 9FT ADLT (ELECTROSURGICAL) ×1
ELECTRODE EMG 18 NIMS (NEUROSURGERY SUPPLIES) ×1 IMPLANT
ELECTRODE REM PT RTRN 9FT ADLT (ELECTROSURGICAL) ×1 IMPLANT
FORCEPS BIPOLAR SPETZLER 8 1.0 (NEUROSURGERY SUPPLIES) ×1 IMPLANT
GAUZE 4X4 16PLY ~~LOC~~+RFID DBL (SPONGE) ×1 IMPLANT
GAUZE SPONGE 4X4 12PLY STRL (GAUZE/BANDAGES/DRESSINGS) ×1 IMPLANT
GENERATOR PULSE INSPIRE (Generator) ×1 IMPLANT
GENERATOR PULSE INSPIRE IV (Generator) ×1 IMPLANT
GLOVE BIO SURGEON STRL SZ 6.5 (GLOVE) IMPLANT
GLOVE BIO SURGEON STRL SZ7.5 (GLOVE) ×1 IMPLANT
GOWN STRL REUS W/ TWL LRG LVL3 (GOWN DISPOSABLE) ×3 IMPLANT
GOWN STRL REUS W/TWL LRG LVL3 (GOWN DISPOSABLE) ×4
KIT BASIN OR (CUSTOM PROCEDURE TRAY) ×1 IMPLANT
KIT NEURO ACCESSORY W/WRENCH (MISCELLANEOUS) IMPLANT
KIT TURNOVER KIT B (KITS) ×1 IMPLANT
LEAD SENSING RESP INSPIRE (Lead) ×1 IMPLANT
LEAD SENSING RESP INSPIRE IV (Lead) ×1 IMPLANT
LOOP VASCLR MAXI BLUE 18IN ST (MISCELLANEOUS) ×1 IMPLANT
LOOP VASCULAR MAXI 18 BLUE (MISCELLANEOUS) ×1
LOOP VASCULAR MINI 18 RED (MISCELLANEOUS) ×1
LOOPS VASCLR MAXI BLUE 18IN ST (MISCELLANEOUS) ×1 IMPLANT
MARKER SKIN DUAL TIP RULER LAB (MISCELLANEOUS) ×2 IMPLANT
NDL HYPO 25GX1X1/2 BEV (NEEDLE) ×1 IMPLANT
NEEDLE HYPO 25GX1X1/2 BEV (NEEDLE) ×1 IMPLANT
NS IRRIG 1000ML POUR BTL (IV SOLUTION) ×1 IMPLANT
PAD ARMBOARD 7.5X6 YLW CONV (MISCELLANEOUS) ×1 IMPLANT
PASSER CATH 38CM DISP (INSTRUMENTS) ×1 IMPLANT
PENCIL SMOKE EVACUATOR (MISCELLANEOUS) ×1 IMPLANT
POSITIONER HEAD DONUT 9IN (MISCELLANEOUS) ×1 IMPLANT
PROBE NERVE STIMULATOR (NEUROSURGERY SUPPLIES) ×1 IMPLANT
REMOTE CONTROL SLEEP INSPIRE (MISCELLANEOUS) ×1 IMPLANT
SET WALTER ACTIVATION W/DRAPE (SET/KITS/TRAYS/PACK) ×1 IMPLANT
SPONGE INTESTINAL PEANUT (DISPOSABLE) ×1 IMPLANT
STAPLER VISISTAT 35W (STAPLE) ×1 IMPLANT
SUT SILK 2 0 SH (SUTURE) ×1 IMPLANT
SUT SILK 3 0 REEL (SUTURE) ×1 IMPLANT
SUT SILK 3 0 SH 30 (SUTURE) ×2 IMPLANT
SUT SILK 3-0 (SUTURE) ×1
SUT SILK 3-0 RB1 30XBRD (SUTURE) ×1
SUT VIC AB 3-0 SH 27 (SUTURE) ×2
SUT VIC AB 3-0 SH 27X BRD (SUTURE) ×2 IMPLANT
SUT VIC AB 4-0 PS2 27 (SUTURE) ×2 IMPLANT
SUTURE SILK 3-0 RB1 30XBRD (SUTURE) ×1 IMPLANT
SYR 10ML LL (SYRINGE) ×1 IMPLANT
TAPE CLOTH SURG 4X10 WHT LF (GAUZE/BANDAGES/DRESSINGS) ×1 IMPLANT
TOWEL GREEN STERILE (TOWEL DISPOSABLE) ×1 IMPLANT
TRAY ENT MC OR (CUSTOM PROCEDURE TRAY) ×1 IMPLANT
VASCULAR TIE MAXI BLUE 18IN ST (MISCELLANEOUS) ×1
VASCULAR TIE MINI RED 18IN STL (MISCELLANEOUS) ×1 IMPLANT

## 2023-03-19 NOTE — Op Note (Signed)
PREOPERATIVE DIAGNOSIS:  Obstructive sleep apnea.   POSTOPERATIVE DIAGNOSIS:  Obstructive sleep apnea.   PROCEDURE:  Revision of hypoglossal nerve stimulator, specifically replacement of the sensor lead and generator.   SURGEON:  Christia Reading, MD   ASSISTANT:  RNFA   ANESTHESIA:  General endotracheal anesthesia.   COMPLICATIONS:  None.   INDICATIONS:  The patient is a 62 year old female with a history of obstructive sleep apnea who underwent placement of a hypoglossal nerve stimulator in November.  Unfortunately, the device was never found to be properly functional.  She presents to the operating room for revision of hypoglossal nerve stimulator.   FINDINGS:  Surgical anatomy was unremarkable.  The generator and leads appeared normal within a healthy capsule.  The device was tested intraoperatively and decision made to replace the sensor lead and generator.  Testing afterwards demonstrated excellent stimulation and sensor lead function.   DESCRIPTION OF PROCEDURE:  The patient was identified in the holding room, informed consent having been obtained, including discussion of risks, benefits and alternatives, the patient was brought to the operative suite and put on the operative table in  supine position.  Anesthesia was induced and the patient was intubated by the anesthesia team without difficulty.  The patient was given intravenous antibiotics during the case.  The eyes were taped closed and the bed was turned 180 degrees from  anesthesia and a shoulder roll was placed.  The right chest incision was injected with 1% lidocaine with 1:100,000 epinephrine.  The nerve integrity monitor was placed in the right lateral tongue and floor of mouth and turned on during the case.  The right neck and chest were prepped and draped in sterile fashion.  The infraclavicular incision was made using a 15 blade scalpel and extended through the subcutaneous tissues using bipolar cautery and scissors, exposing the  generator that was then freed from surrounding tissues and delivered out of the chest pocket.  The device was tested.  The sensor lead was removed from the device and a new sensor lead inserted.  The device was retested.  Given results of testing and upon further discussion with the [facility] personnel, the decision was made to replace both the sensor lead and the generator.  Thus, the leads were dissected free from capsular tissue.  The original sensor lead wast dissected from surrounding tissues through the pectoralis muscle.  The anchor was freed and the lead removed from between the ribs.  The new sensor lead was removed from the old generator along with the stimulating lead.  The new sensor lead was then placed in the same rib space position and sutured in place using 3-0 silk.  The second anchor was then sutured to the pectoralis fascia.  A new generator was then opened and both the old stimulating lead and the new sensor lead were inserted into their respective docks and tightened into place.  The generator was placed back in the pocket.  After testing demonstrated good stimulation and good sensor lead function, the wound was copiously irrigated with saline.  The wound was closed in the deep tissues with 3-0 Vicryl suture in simple interrupted fashion and in the subcutaneous layer using 4-0 Vicryl suture in a simple interrupted fashion.  The incision was covered with Dermabond.  The patient was then cleaned off and drapes were removed.  The nerve integrity monitor  was removed.  The incision was then covered with gauze pressure dressing.  The patient was then returned to anesthesia for wakeup and was  extubated and moved to the recovery room in stable condition.

## 2023-03-19 NOTE — H&P (Signed)
Ashlee Robbins is an 62 y.o. female.   Chief Complaint: Sleep apnea HPI: 62 year old female with obstructive sleep apnea who underwent placement of a hypoglossal nerve stimulator in November.  Unfortunately, the device has not worked properly.  In careful consideration of options, we have agreed to proceed with revision of the implant.    Past Medical History:  Diagnosis Date   ADHD (attention deficit hyperactivity disorder)    Anxiety    Breast cancer screening, high risk patient 12/09/2012   Cancer (HCC)    skin CA removed from both legs, face   Hx of cervical cancer    Hypertension    Sleep apnea     Past Surgical History:  Procedure Laterality Date   COLONOSCOPY     DRUG INDUCED ENDOSCOPY Bilateral 08/07/2022   Procedure: DRUG INDUCED ENDOSCOPY;  Surgeon: Christia Reading, MD;  Location: [facility];  Service: ENT;  Laterality: Bilateral;   EYE SURGERY     Lasik   HERNIA REPAIR     IMPLANTATION OF HYPOGLOSSAL NERVE STIMULATOR Right 09/18/2022   Procedure: IMPLANTATION OF HYPOGLOSSAL NERVE STIMULATOR;  Surgeon: Christia Reading, MD;  Location:  SURGERY CENTER;  Service: ENT;  Laterality: Right;   REPLACEMENT TOTAL KNEE Right    SHOULDER OPEN ROTATOR CUFF REPAIR Right 01/31/2022   TUBAL LIGATION     UPPER GI ENDOSCOPY      Family History  Problem Relation Age of Onset   Cancer Mother        THYROID   Breast cancer Mother 45   Diabetes Father    Thyroid cancer Maternal Aunt    Breast cancer Maternal Grandmother    Cancer Brother        THYROID   Hypertension Brother    Social History:  reports that she has never smoked. She has never used smokeless tobacco. She reports current alcohol use of about 4.0 standard drinks of alcohol per week. She reports that she does not use drugs.  Allergies: No Known Allergies  Medications Prior to Admission  Medication Sig Dispense Refill   acetaminophen (TYLENOL) 500 MG tablet Take 500-1,000 mg by mouth every 6  (six) hours as needed (pain.).     amLODipine (NORVASC) 5 MG tablet Take 5 mg by mouth in the morning.     amoxicillin (AMOXIL) 500 MG capsule Take 500 mg by mouth 3 (three) times daily.     amphetamine-dextroamphetamine (ADDERALL XR) 20 MG 24 hr capsule Take 20 mg by mouth in the morning.     losartan (COZAAR) 100 MG tablet Take 1 tablet (100 mg total) by mouth daily.     sertraline (ZOLOFT) 25 MG tablet Take 25 mg by mouth in the morning.     valACYclovir (VALTREX) 1000 MG tablet Take 1,000 mg by mouth 2 (two) times daily as needed (fever blisters).     vitamin B-12 (CYANOCOBALAMIN) 500 MCG tablet Take 500 mcg by mouth daily. Pt does not remember the dosage     Vitamin D, Ergocalciferol, (DRISDOL) 1.25 MG (50000 UNIT) CAPS capsule Take 50,000 Units by mouth every Monday.     estradiol (ESTRACE) 0.1 MG/GM vaginal cream Place 1 Applicatorful vaginally 3 (three) times a week.      Results for orders placed or performed during the hospital encounter of 03/19/23 (from the past 48 hour(s))  Basic metabolic panel per protocol     Status: Abnormal   Collection Time: 03/19/23  9:53 AM  Result Value Ref Range  Sodium 138 135 - 145 mmol/L   Potassium 4.1 3.5 - 5.1 mmol/L    Comment: HEMOLYSIS AT THIS LEVEL MAY AFFECT RESULT   Chloride 106 98 - 111 mmol/L   CO2 23 22 - 32 mmol/L   Glucose, Bld 79 70 - 99 mg/dL    Comment: Glucose reference range applies only to samples taken after fasting for at least 8 hours.   BUN 22 8 - 23 mg/dL   Creatinine, Ser 4.09 0.44 - 1.00 mg/dL   Calcium 8.6 (L) 8.9 - 10.3 mg/dL   GFR, Estimated >81 >19 mL/min    Comment: (NOTE) Calculated using the CKD-EPI Creatinine Equation (2021)    Anion gap 9 5 - 15    Comment: Performed at [facility], [address] [place], [place] [address]  CBC per protocol     Status: None   Collection Time: 03/19/23  9:53 AM  Result Value Ref Range   WBC 4.1 4.0 - 10.5 K/uL   RBC 3.91 3.87 - 5.11 MIL/uL   Hemoglobin 13.1  12.0 - 15.0 g/dL   HCT 95.6 21.3 - 08.6 %   MCV 99.2 80.0 - 100.0 fL   MCH 33.5 26.0 - 34.0 pg   MCHC 33.8 30.0 - 36.0 g/dL   RDW 57.8 46.9 - 62.9 %   Platelets 314 150 - 400 K/uL   nRBC 0.0 0.0 - 0.2 %    Comment: Performed at [facility], [address] [place], [place] [address]   No results found.  Review of Systems  All other systems reviewed and are negative.   Blood pressure (!) 123/91, pulse 73, temperature 97.7 F (36.5 C), resp. rate 20, height 5\' 5"  (1.651 m), weight 68.9 kg, SpO2 96 %. Physical Exam Constitutional:      Appearance: Normal appearance. She is normal weight.  HENT:     Head: Normocephalic and atraumatic.     Right Ear: External ear normal.     Left Ear: External ear normal.     Nose: Nose normal.     Mouth/Throat:     Mouth: Mucous membranes are moist.     Pharynx: Oropharynx is clear.  Eyes:     Extraocular Movements: Extraocular movements intact.     Conjunctiva/sclera: Conjunctivae normal.     Pupils: Pupils are equal, round, and reactive to light.  Cardiovascular:     Rate and Rhythm: Normal rate.  Pulmonary:     Effort: Pulmonary effort is normal.  Musculoskeletal:     Cervical back: Normal range of motion.  Skin:    General: Skin is warm and dry.  Neurological:     General: No focal deficit present.     Mental Status: She is alert and oriented to person, place, and time.  Psychiatric:        Mood and Affect: Mood normal.        Behavior: Behavior normal.        Thought Content: Thought content normal.        Judgment: Judgment normal.      Assessment/Plan Obstructive sleep apnea  To OR for revision of the hypoglossal nerve stimulator.  Data suggests that the problem is in the sense lead which will be the first component replaced.  If this does not address the problem, we will move on to the generator.  In discussing risks and options, she does not wish for the neck to be disturbed today and understands that that could  leave a component not working properly.  Christia Reading, MD 03/19/2023, 12:09 PM

## 2023-03-19 NOTE — Brief Op Note (Signed)
03/19/2023  2:03 PM  PATIENT:  Ashlee Robbins  62 y.o. female  PRE-OPERATIVE DIAGNOSIS:  Obstructive Sleep Apnea  POST-OPERATIVE DIAGNOSIS:  Obstructive Sleep Apnea  PROCEDURE:  Procedure(s): REVISION IMPLANTATION OF HYPOGLOSSAL NERVE STIMULATOR REPLACEMENT (Right)  SURGEON:  Surgeon(s) and Role:    Christia Reading, MD - Primary  PHYSICIAN ASSISTANT:   ASSISTANTS: RNFA  ANESTHESIA:   general  EBL:  Minimal   BLOOD ADMINISTERED:none  DRAINS: none   LOCAL MEDICATIONS USED:  LIDOCAINE   SPECIMEN:  No Specimen  DISPOSITION OF SPECIMEN:  N/A  COUNTS:  YES  TOURNIQUET:  * No tourniquets in log *  DICTATION: .Note written in EPIC  PLAN OF CARE: Discharge to home after PACU  PATIENT DISPOSITION:  PACU - hemodynamically stable.   Delay start of Pharmacological VTE agent (>24hrs) due to surgical blood loss or risk of bleeding: no

## 2023-03-19 NOTE — Transfer of Care (Signed)
Immediate Anesthesia Transfer of Care Note  Patient: Ashlee Robbins  Procedure(s) Performed: REVISION IMPLANTATION OF HYPOGLOSSAL NERVE STIMULATOR REPLACEMENT (Right: Chest)  Patient Location: PACU  Anesthesia Type:General  Level of Consciousness: awake and alert   Airway & Oxygen Therapy: Patient Spontanous Breathing  Post-op Assessment: Report given to RN and Post -op Vital signs reviewed and stable  Post vital signs: Reviewed and stable  Last Vitals:  Vitals Value Taken Time  BP 141/94 03/19/23 1412  Temp    Pulse 78 03/19/23 1412  Resp 18 03/19/23 1412  SpO2 96 % 03/19/23 1412  Vitals shown include unvalidated device data.  Last Pain:  Vitals:   03/19/23 1019  PainSc: 0-No pain         Complications: No notable events documented.

## 2023-03-19 NOTE — Anesthesia Procedure Notes (Signed)
Procedure Name: Intubation Date/Time: 03/19/2023 12:33 PM  Performed by: Zollie Beckers, CRNAPre-anesthesia Checklist: Patient identified, Emergency Drugs available, Suction available and Patient being monitored Patient Re-evaluated:Patient Re-evaluated prior to induction Oxygen Delivery Method: Circle system utilized Preoxygenation: Pre-oxygenation with 100% oxygen Induction Type: IV induction Ventilation: Mask ventilation without difficulty Laryngoscope Size: Mac and 3 Grade View: Grade I Tube type: Oral Tube size: 7.0 mm Number of attempts: 1 Airway Equipment and Method: Stylet Placement Confirmation: ETT inserted through vocal cords under direct vision, positive ETCO2 and breath sounds checked- equal and bilateral Secured at: 22 cm Tube secured with: Tape Dental Injury: Teeth and Oropharynx as per pre-operative assessment

## 2023-03-19 NOTE — Anesthesia Preprocedure Evaluation (Addendum)
Anesthesia Evaluation  Patient identified by MRN, date of birth, ID band Patient awake    Reviewed: Allergy & Precautions, NPO status , Patient's Chart, lab work & pertinent test results  History of Anesthesia Complications Negative for: history of anesthetic complications  Airway Mallampati: I  TM Distance: >3 FB Neck ROM: Full    Dental  (+) Dental Advisory Given, Teeth Intact   Pulmonary sleep apnea and Continuous Positive Airway Pressure Ventilation    Pulmonary exam normal        Cardiovascular hypertension, Pt. on medications Normal cardiovascular exam     Neuro/Psych  PSYCHIATRIC DISORDERS Anxiety     negative neurological ROS     GI/Hepatic negative GI ROS, Neg liver ROS,,,  Endo/Other  negative endocrine ROS    Renal/GU negative Renal ROS     Musculoskeletal negative musculoskeletal ROS (+)    Abdominal   Peds  (+) ADHD Hematology negative hematology ROS (+)   Anesthesia Other Findings   Reproductive/Obstetrics  Cervical cancer s/p tubal ligation                              Anesthesia Physical Anesthesia Plan  ASA: 2  Anesthesia Plan: General   Post-op Pain Management: Tylenol PO (pre-op)*   Induction: Intravenous  PONV Risk Score and Plan: 3 and Treatment may vary due to age or medical condition, Ondansetron and Dexamethasone  Airway Management Planned: Oral ETT  Additional Equipment: None  Intra-op Plan:   Post-operative Plan: Extubation in OR  Informed Consent: I have reviewed the patients History and Physical, chart, labs and discussed the procedure including the risks, benefits and alternatives for the proposed anesthesia with the patient or authorized representative who has indicated his/her understanding and acceptance.     Dental advisory given  Plan Discussed with: CRNA and Anesthesiologist  Anesthesia Plan Comments:        Anesthesia  Quick Evaluation

## 2023-03-20 ENCOUNTER — Encounter (HOSPITAL_COMMUNITY): Payer: Self-pay | Admitting: Otolaryngology

## 2023-03-20 NOTE — Anesthesia Postprocedure Evaluation (Signed)
Anesthesia Post Note  Patient: Ashlee Robbins  Procedure(s) Performed: REVISION IMPLANTATION OF HYPOGLOSSAL NERVE STIMULATOR REPLACEMENT (Right: Chest)     Patient location during evaluation: PACU Anesthesia Type: General Level of consciousness: awake and alert Pain management: pain level controlled Vital Signs Assessment: post-procedure vital signs reviewed and stable Respiratory status: spontaneous breathing, nonlabored ventilation and respiratory function stable Cardiovascular status: stable and blood pressure returned to baseline Anesthetic complications: no   No notable events documented.  Last Vitals:  Vitals:   03/19/23 1430 03/19/23 1445  BP: (!) 142/99 (!) 140/100  Pulse: 73 70  Resp: 18 19  Temp:  36.6 C  SpO2: 100% 100%    Last Pain:  Vitals:   03/19/23 1019  PainSc: 0-No pain                 Beryle Lathe

## 2023-05-14 ENCOUNTER — Other Ambulatory Visit: Payer: Self-pay | Admitting: Family Medicine

## 2023-05-14 DIAGNOSIS — Z1231 Encounter for screening mammogram for malignant neoplasm of breast: Secondary | ICD-10-CM

## 2023-05-16 ENCOUNTER — Institutional Professional Consult (permissible substitution): Payer: BC Managed Care – PPO | Admitting: Plastic Surgery

## 2023-05-19 ENCOUNTER — Ambulatory Visit: Payer: BC Managed Care – PPO

## 2023-06-04 ENCOUNTER — Ambulatory Visit
Admission: RE | Admit: 2023-06-04 | Discharge: 2023-06-04 | Disposition: A | Discharge status: Home / Self Care | Payer: BC Managed Care – PPO | Source: Ambulatory Visit | Attending: Family Medicine | Admitting: Family Medicine

## 2023-06-04 DIAGNOSIS — Z1231 Encounter for screening mammogram for malignant neoplasm of breast: Secondary | ICD-10-CM

## 2023-07-28 ENCOUNTER — Inpatient Hospital Stay: Payer: BC Managed Care – PPO | Attending: Hematology and Oncology | Admitting: Hematology and Oncology

## 2023-07-28 NOTE — Assessment & Plan Note (Deleted)
High-risk for developing breast cancer due to her family history. Her breast cancer risk estimated at 20-25%. Patient has had BRCA1 and 2 testing performed which was negative for both. Could not tolerate Evista. Currently on surveillance.   Breast Cancer Surveillance: 1. Breast exam 07/28/2023: Normal 2. Mammogram 06/06/2023 no abnormalities.  Breast Density Category C.  3.  Breast MRI 11/22/2019: Benign, breast density category B   I recommended that she obtain contrast-enhanced mammograms annually instead of mammograms alternating with MRIs. Return to clinic in 1 year for follow-up

## 2023-08-02 ENCOUNTER — Telehealth: Payer: Self-pay | Admitting: Hematology and Oncology

## 2023-08-23 ENCOUNTER — Encounter: Payer: Self-pay | Admitting: Hematology and Oncology

## 2023-08-25 ENCOUNTER — Inpatient Hospital Stay: Payer: BC Managed Care – PPO | Admitting: Hematology and Oncology

## 2023-08-25 NOTE — Assessment & Plan Note (Deleted)
High-risk for developing breast cancer due to her family history. Her breast cancer risk estimated at 20-25%. Patient has had BRCA1 and 2 testing performed which was negative for both. Could not tolerate Evista. Currently on surveillance.   Breast Cancer Surveillance: 1. Breast exam 08/25/2023: Normal 2. Mammogram 06/06/2019 no abnormalities.  Breast Density Category C.  3.  Breast MRI 08/13/2022: Benign, breast density category B   I discussed with the patient about breast MRI versus contrast-enhanced mammograms. Return to clinic in 1 year for follow-up

## 2023-08-26 ENCOUNTER — Telehealth: Payer: Self-pay | Admitting: Hematology and Oncology

## 2023-08-26 NOTE — Telephone Encounter (Signed)
Per scheduling message sent on 08/25/2023 Left patient a messages regarding rescheduled appointment times/dates for follow up; left callback if needed for rescheduling

## 2023-09-17 ENCOUNTER — Inpatient Hospital Stay: Payer: BC Managed Care – PPO | Attending: Hematology and Oncology | Admitting: Hematology and Oncology

## 2023-09-17 ENCOUNTER — Encounter: Payer: Self-pay | Admitting: *Deleted

## 2023-09-17 VITALS — BP 131/81 | HR 83 | Temp 97.9°F | Resp 14 | Wt 152.3 lb

## 2023-09-17 DIAGNOSIS — G4733 Obstructive sleep apnea (adult) (pediatric): Secondary | ICD-10-CM | POA: Diagnosis not present

## 2023-09-17 DIAGNOSIS — Z9189 Other specified personal risk factors, not elsewhere classified: Secondary | ICD-10-CM | POA: Diagnosis not present

## 2023-09-17 DIAGNOSIS — Z803 Family history of malignant neoplasm of breast: Secondary | ICD-10-CM | POA: Diagnosis not present

## 2023-09-17 DIAGNOSIS — Z79899 Other long term (current) drug therapy: Secondary | ICD-10-CM | POA: Insufficient documentation

## 2023-09-17 MED ORDER — AMOXICILLIN 500 MG PO CAPS: 500.0000 mg | ORAL_CAPSULE | Freq: Three times a day (TID) | ORAL | 0 refills | Status: DC

## 2023-09-17 MED ORDER — DIAZEPAM 5 MG PO TABS: 5.0000 mg | ORAL_TABLET | Freq: Once | ORAL | 0 refills | Status: DC | PRN

## 2023-09-17 NOTE — Progress Notes (Signed)
Patient Care Team: Elliot Cousin., MD as PCP - General (Family Medicine)  DIAGNOSIS:  Encounter Diagnoses  Name Primary?   At high risk for breast cancer Yes   Obstructive sleep apnea       CHIEF COMPLIANT: Follow-up of being high risk for breast cancer  HISTORY OF PRESENT ILLNESS:   History of Present Illness   Patient is a high risk for breast cancer.  She is here for follow-up to discuss her treatment plan. She underwent Inspire therapy for sleep apnea in November, but the device stopped working five weeks later. Despite multiple tests, the cause of the malfunction was not identified, and the patient had to undergo a second procedure in May to replace components of the device. The patient reports that the device is now working, but she would not recommend the therapy due to her experience.  The patient also reports taking Adderall extended release, an additional 10mg  dose of Adderall as needed for intense focus, losartan for hypertension, AMP, Valtrex for fever blisters, Zoloft, and sertraline for anxiety. She denies taking B12, vitamin D, creams, and amoxicillin.  The patient also mentions a potential onset of a cold, with symptoms settling in her TMJ area, causing significant discomfort. She requests a prescription for amoxicillin to prevent the progression of symptoms. She also requests a prescription for Valium to manage claustrophobia during an upcoming MRI.         ALLERGIES:  has No Known Allergies.  MEDICATIONS:  Current Outpatient Medications  Medication Sig Dispense Refill   amoxicillin (AMOXIL) 500 MG capsule Take 1 capsule (500 mg total) by mouth 3 (three) times daily. 21 capsule 0   diazepam (VALIUM) 5 MG tablet Take 1 tablet (5 mg total) by mouth once as needed for up to 1 dose for anxiety. Take 2 tabs for Breast MRI 2 tablet 0   amLODipine (NORVASC) 5 MG tablet Take 5 mg by mouth in the morning.     amphetamine-dextroamphetamine (ADDERALL XR) 20 MG 24 hr  capsule Take 20 mg by mouth in the morning.     losartan (COZAAR) 100 MG tablet Take 1 tablet (100 mg total) by mouth daily.     sertraline (ZOLOFT) 25 MG tablet Take 25 mg by mouth in the morning.     valACYclovir (VALTREX) 1000 MG tablet Take 1,000 mg by mouth 2 (two) times daily as needed (fever blisters).     No current facility-administered medications for this visit.    PHYSICAL EXAMINATION: ECOG PERFORMANCE STATUS: 1 - Symptomatic but completely ambulatory  Vitals:   09/17/23 1430  BP: 131/81  Pulse: 83  Resp: 14  Temp: 97.9 F (36.6 C)  SpO2: 100%   Filed Weights   09/17/23 1430  Weight: 152 lb 4.8 oz (69.1 kg)      LABORATORY DATA:  I have reviewed the data as listed    Latest Ref Rng & Units 03/19/2023    9:53 AM 12/09/2012   12:30 PM 11/26/2010    1:39 PM  CMP  Glucose 70 - 99 mg/dL 79  74  85   BUN 8 - 23 mg/dL 22  11.9  16   Creatinine 0.44 - 1.00 mg/dL 1.47  1.0  8.29   Sodium 135 - 145 mmol/L 138  144  140   Potassium 3.5 - 5.1 mmol/L 4.1  4.0  4.2   Chloride 98 - 111 mmol/L 106  107  101   CO2 22 - 32 mmol/L 23  28  26   Calcium 8.9 - 10.3 mg/dL 8.6  9.3  21.3   Total Protein 6.4 - 8.3 g/dL  7.2  7.2   Total Bilirubin 0.20 - 1.20 mg/dL  0.86  0.6   Alkaline Phos 40 - 150 U/L  54  44   AST 5 - 34 U/L  21  26   ALT 0 - 55 U/L  19  18     Lab Results  Component Value Date   WBC 4.1 03/19/2023   HGB 13.1 03/19/2023   HCT 38.8 03/19/2023   MCV 99.2 03/19/2023   PLT 314 03/19/2023   NEUTROABS 3.5 12/09/2012    ASSESSMENT & PLAN:  At high risk for breast cancer High-risk for developing breast cancer due to her family history. Her breast cancer risk estimated at 20-25%. Patient has had BRCA1 and 2 testing performed which was negative for both. Could not tolerate Evista. Currently on surveillance.   Breast Cancer Surveillance: 1. Mammogram 06/06/2023 no abnormalities.  Breast Density Category C.  2.  Breast MRI 11/22/2019: Benign, breast density  category B We will obtain another breast MRI in February 2025.   Return to clinic in 1 year for follow-up ------------------------------------- Assessment and Plan    Sleep Apnea Patient underwent Inspire therapy with initial device failure and subsequent replacement. Current status of device efficacy is unclear.    Medication Management Patient is on Adderall (extended release and 10mg  PRN), Losartan, AMP, Valtrex (PRN for fever), Zoloft, and Sertraline for anxiety. Patient denied use of B12, Vitamin D, and creams. -Continue current medications as prescribed.  Breast Health Patient has annual screenings and is due for an MRI. -Schedule MRI for February 2025. -Prescribe Valium for MRI procedure (2 tablets, patient to decide on dose).  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 -Prescribe Amoxicillin for potential infection.  Follow-up Plan to discuss MRI results over the phone and see patient in person in one year.          Orders Placed This Encounter  Procedures   MR BREAST BILATERAL W WO CONTRAST INC CAD    Standing Status:   Future    Standing Expiration Date:   09/16/2024    Order Specific Question:   If indicated for the ordered procedure, I authorize the administration of contrast media per Radiology protocol    Answer:   Yes    Order Specific Question:   What is the patient's sedation requirement?    Answer:   Anti-anxiety    Order Specific Question:   Does the patient have a pacemaker or implanted devices?    Answer:   No    Order Specific Question:   Preferred imaging location?    Answer:   [facility] (table limit-550lbs)    Order Specific Question:   Release to patient    Answer:   Immediate   The patient has a good understanding of the overall plan. she agrees with it. she will call with any problems that may develop before the next visit here. Total  time spent: 30 mins including face to face time and time spent for planning, charting and co-ordination of care   Tamsen Meek, MD 09/17/23

## 2023-09-17 NOTE — Assessment & Plan Note (Addendum)
High-risk for developing breast cancer due to her family history. Her breast cancer risk estimated at 20-25%. Patient has had BRCA1 and 2 testing performed which was negative for both. Could not tolerate Evista. Currently on surveillance.   Breast Cancer Surveillance: 1. Mammogram 06/06/2023 no abnormalities.  Breast Density Category C.  2.  Breast MRI 11/22/2019: Benign, breast density category B We will obtain another breast MRI in February 2025.   Return to clinic in 1 year for follow-up

## 2023-09-17 NOTE — Progress Notes (Signed)
Pt presented medical device identification care for Upper Airway Stimulation System that was placed 03/19/2023.  Copy of card sent to HIM to be scanned into pt chart.  Pt chart also flagged for difficult airway related to this device.

## 2023-09-24 ENCOUNTER — Other Ambulatory Visit: Payer: Self-pay | Admitting: *Deleted

## 2023-09-24 DIAGNOSIS — Z9189 Other specified personal risk factors, not elsewhere classified: Secondary | ICD-10-CM

## 2023-09-24 NOTE — Progress Notes (Signed)
Received call from pt stating [facility] [phone]) is able to proceed with Breast MRI with Inspire stimulation system in place.  Order will need to be faxed to [phone] with insurance auth number on the order.  RN reached out to PA team to obtain auth number.  Once number is obtained, staff will fax order.

## 2023-09-24 NOTE — Progress Notes (Signed)
Received call from pt stating she is unable to have a breast MRI due to the [facility] device that she had placed.  Reviewed with MD and verbal orders received for pt to undergo contrast enhanced mammogram.  MD contrast enhanced mammogram will be repeated in 6 months due to pt being high risk and then will go to yearly contrast enhanced mammograms. Pt educated and verbalized understanding.

## 2023-11-13 ENCOUNTER — Telehealth: Payer: Self-pay | Admitting: Cardiology

## 2023-11-13 NOTE — Telephone Encounter (Signed)
Paper Work Dropped Off: Sleep apnea  Date:11/13/23  Location of paper: [place] mail box

## 2023-11-20 ENCOUNTER — Ambulatory Visit: Payer: BC Managed Care – PPO | Attending: Cardiology | Admitting: Cardiology

## 2023-11-20 NOTE — Progress Notes (Signed)
This encounter was created in error - please disregard.

## 2023-11-25 ENCOUNTER — Telehealth: Payer: Self-pay

## 2023-11-25 ENCOUNTER — Encounter: Payer: Self-pay | Admitting: *Deleted

## 2023-11-25 NOTE — Progress Notes (Unsigned)
Received auth for pt breast MRI #[id number] valid 11/25/23-12/24/23.  RN successfully faxed orders to [facility] at [phone].

## 2023-11-26 ENCOUNTER — Other Ambulatory Visit: Payer: Self-pay | Admitting: *Deleted

## 2023-11-26 DIAGNOSIS — Z9189 Other specified personal risk factors, not elsewhere classified: Secondary | ICD-10-CM

## 2023-11-26 NOTE — Progress Notes (Signed)
 Received call from [facility] stating they are unable to proceed with breast MRI due to pt inspire device.  Stating their machine is not calibrated to perform such imagine with device.  RN reviewed with MD and verbal orders received for pt to undergo contrast enhanced mammogram.  Orders placed.  Pt notified and verbalized understanding.

## 2023-11-27 NOTE — Telephone Encounter (Signed)
 Pt said she needs to speak to [place] before her appt on monday

## 2023-11-28 ENCOUNTER — Encounter: Payer: Self-pay | Admitting: Hematology and Oncology

## 2023-11-28 ENCOUNTER — Telehealth: Payer: Self-pay

## 2023-11-28 NOTE — Telephone Encounter (Signed)
 Attempted to call patient to ensure she is aware of changed time for appointment on 12/01/23. No answer. MyChart message sent.

## 2023-11-28 NOTE — Telephone Encounter (Signed)
 Call to patient to confirm she can attend appt 12/01/23 with [facility] Rep. Patient states she can and also asks that Belva Boyden rep can provide her with MRI compatibility information. Request forwarded to [facility] rep via email.

## 2023-12-01 ENCOUNTER — Telehealth: Payer: Self-pay

## 2023-12-01 ENCOUNTER — Ambulatory Visit: Payer: BC Managed Care – PPO | Attending: Cardiology | Admitting: Cardiology

## 2023-12-01 VITALS — BP 138/80 | HR 94 | Ht 65.0 in | Wt 156.8 lb

## 2023-12-01 DIAGNOSIS — G4733 Obstructive sleep apnea (adult) (pediatric): Secondary | ICD-10-CM | POA: Diagnosis not present

## 2023-12-01 DIAGNOSIS — I10 Essential (primary) hypertension: Secondary | ICD-10-CM

## 2023-12-01 NOTE — Telephone Encounter (Signed)
 Pt called and states she found a radiology location which can accommodate her MRI with an Inspire. She prefers we fax the order to [facility] [phone]. Order faxed per pt request, with instructions to fax results to us . Pt is aware and verbalized thanks.  Fax confirmation received.
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
## 2023-12-01 NOTE — Patient Instructions (Addendum)
 Medication Instructions:  Your physician recommends that you continue on your current medications as directed. Please refer to the Current Medication list given to you today.  *If you need a refill on your cardiac medications before your next appointment, please call your pharmacy*   Lab Work: None.  If you have labs (blood work) drawn today and your tests are completely normal, you will receive your results only by: MyChart Message (if you have MyChart) OR A paper copy in the mail If you have any lab test that is abnormal or we need to change your treatment, we will call you to review the results.   Testing/Procedures: Dr. Micael Adas has recommended a coronary calcium score CT. This is a test that can provide early identification of coronary artery disease. The patient cost is $100.   Follow-Up:   Other Instructions Dr. Micael Adas has referred you to Dr. Patt Boozer for sleep medicine. [facility] - [facility] [address] [place], [place] [address] Directions [phone]

## 2023-12-03 ENCOUNTER — Telehealth: Payer: Self-pay

## 2023-12-03 ENCOUNTER — Other Ambulatory Visit: Payer: Self-pay | Admitting: *Deleted

## 2023-12-03 MED ORDER — DIAZEPAM 5 MG PO TABS: 5.0000 mg | ORAL_TABLET | Freq: Once | ORAL | 0 refills | Status: DC | PRN

## 2023-12-03 NOTE — Telephone Encounter (Signed)
Pt called to let us know she was successfully scheduled for MRI at a [facility] radiology center in [place], [place]. She will call with any other concerns.

## 2023-12-03 NOTE — Telephone Encounter (Signed)
Received call from pt requesting refill on Valium for claustrophobia with upcoming breast MRI. Prescription called in to pt pharmacy on file, pt educated and verbalized understanding.

## 2023-12-05 ENCOUNTER — Ambulatory Visit (HOSPITAL_COMMUNITY)
Admission: RE | Admit: 2023-12-05 | Discharge: 2023-12-05 | Disposition: A | Discharge status: Home / Self Care | Payer: Self-pay | Source: Ambulatory Visit | Attending: Cardiology | Admitting: Cardiology

## 2023-12-05 DIAGNOSIS — G4733 Obstructive sleep apnea (adult) (pediatric): Secondary | ICD-10-CM | POA: Insufficient documentation

## 2023-12-05 DIAGNOSIS — I10 Essential (primary) hypertension: Secondary | ICD-10-CM | POA: Insufficient documentation

## 2023-12-06 ENCOUNTER — Encounter: Payer: Self-pay | Admitting: Cardiology

## 2023-12-08 ENCOUNTER — Encounter: Payer: Self-pay | Admitting: Hematology and Oncology

## 2023-12-08 ENCOUNTER — Other Ambulatory Visit: Payer: Self-pay

## 2023-12-08 ENCOUNTER — Encounter: Payer: Self-pay | Admitting: Cardiology

## 2023-12-08 DIAGNOSIS — R931 Abnormal findings on diagnostic imaging of heart and coronary circulation: Secondary | ICD-10-CM | POA: Insufficient documentation

## 2023-12-08 DIAGNOSIS — Z9189 Other specified personal risk factors, not elsewhere classified: Secondary | ICD-10-CM

## 2023-12-08 DIAGNOSIS — I7121 Aneurysm of the ascending aorta, without rupture: Secondary | ICD-10-CM | POA: Insufficient documentation

## 2023-12-15 ENCOUNTER — Other Ambulatory Visit: Payer: Self-pay

## 2023-12-15 DIAGNOSIS — I2510 Atherosclerotic heart disease of native coronary artery without angina pectoris: Secondary | ICD-10-CM

## 2023-12-15 DIAGNOSIS — I719 Aortic aneurysm of unspecified site, without rupture: Secondary | ICD-10-CM

## 2023-12-19 ENCOUNTER — Encounter: Payer: Self-pay | Admitting: Cardiology

## 2023-12-23 ENCOUNTER — Ambulatory Visit (HOSPITAL_COMMUNITY): Payer: Self-pay

## 2023-12-26 ENCOUNTER — Encounter: Payer: Self-pay | Admitting: *Deleted

## 2023-12-30 ENCOUNTER — Ambulatory Visit (HOSPITAL_COMMUNITY)
Admission: RE | Admit: 2023-12-30 | Discharge: 2023-12-30 | Disposition: A | Discharge status: Home / Self Care | Source: Ambulatory Visit | Attending: Cardiology | Admitting: Cardiology

## 2023-12-30 DIAGNOSIS — I2510 Atherosclerotic heart disease of native coronary artery without angina pectoris: Secondary | ICD-10-CM | POA: Diagnosis present

## 2023-12-30 MED ORDER — IOHEXOL 350 MG/ML SOLN
75.0000 mL | Freq: Once | INTRAVENOUS | Status: AC | PRN
  Administered 2023-12-30: 75 mL via INTRAVENOUS

## 2023-12-31 ENCOUNTER — Other Ambulatory Visit: Payer: Self-pay | Admitting: Nurse Practitioner

## 2023-12-31 DIAGNOSIS — Z780 Asymptomatic menopausal state: Secondary | ICD-10-CM

## 2024-01-05 ENCOUNTER — Ambulatory Visit
Admission: RE | Admit: 2024-01-05 | Discharge: 2024-01-05 | Disposition: A | Discharge status: Home / Self Care | Source: Ambulatory Visit | Attending: Family Medicine | Admitting: Family Medicine

## 2024-01-05 ENCOUNTER — Other Ambulatory Visit: Payer: Self-pay

## 2024-01-05 VITALS — BP 138/93 | HR 78 | Temp 97.9°F | Resp 18 | Ht 67.0 in | Wt 153.0 lb

## 2024-01-05 DIAGNOSIS — S61402A Unspecified open wound of left hand, initial encounter: Secondary | ICD-10-CM

## 2024-01-05 DIAGNOSIS — M7989 Other specified soft tissue disorders: Secondary | ICD-10-CM

## 2024-01-05 DIAGNOSIS — Z23 Encounter for immunization: Secondary | ICD-10-CM

## 2024-01-05 MED ORDER — AMOXICILLIN-POT CLAVULANATE 875-125 MG PO TABS: 1.0000 | ORAL_TABLET | Freq: Two times a day (BID) | ORAL | 0 refills | Status: DC

## 2024-01-05 MED ORDER — TETANUS-DIPHTH-ACELL PERTUSSIS 5-2.5-18.5 LF-MCG/0.5 IM SUSY
0.5000 mL | PREFILLED_SYRINGE | Freq: Once | INTRAMUSCULAR | Status: AC
  Administered 2024-01-05: 0.5 mL via INTRAMUSCULAR

## 2024-01-05 NOTE — ED Provider Notes (Signed)
 [facility]    CSN: [id number] Arrival date & time: 01/05/24  0808      History   Chief Complaint Chief Complaint  Patient presents with   Finger Injury    I injured my left hand middle finger. This is the same finger I had operated on around year ago.  Surgeryfor trigger finger. Dr Darin Engels (retired) [facility]. In crease got pinched - then MY dog bit me when giving him pill - infection? - Entered by patient    HPI Ashlee Robbins is a 63 y.o. female.   The history is provided by the patient.   Swelling left middle finger states had surgery on it 9 months ago for trigger finger.  She sustained a laceration described as a "pinch" over the PIPJ a week ago then several days ago was bitten by her dog as she was giving it a pill. Admits limited range of motion.  Denies redness, warmth, paresthesias, weakness. She has an appointment with her hand specialist April 3 for separate issue..  She is right-handed.  Denies fever, chills.  Chart reviewed Past Medical History:  Diagnosis Date   ADHD (attention deficit hyperactivity disorder)    Agatston coronary artery calcium score less than 100    cor cal score 7.32   Anxiety    Ascending aortic aneurysm (HCC)    4.7cm on coronary Ca score CT 11/2023   Breast cancer screening, high risk patient 12/09/2012   Cancer (HCC)    skin CA removed from both legs, face   Hx of cervical cancer    Hypertension    Sleep apnea     Patient Active Problem List   Diagnosis Date Noted   Ascending aortic aneurysm (HCC)    Agatston coronary artery calcium score less than 100    Obstructive sleep apnea 09/17/2023   At high risk for breast cancer 07/22/2022   Trigger middle finger of left hand 05/31/2019   Pain of right hand 06/01/2018   Torn ear lobe 08/01/2017   S/P TKR (total knee replacement) using cement, 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   Anxiety disorder 06/17/2016   Primary insomnia 06/17/2016   Vitamin D deficiency 06/17/2016    Essential hypertension 12/19/2015   Vitamin B12 deficiency 12/19/2015   Right rotator cuff tendinitis 05/24/2014   Follicular cyst of skin and subcutaneous tissue 12/21/2013   Piriformis syndrome of right side 08/05/2013   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   Breast cancer screening, high risk patient 12/09/2012   HIP PAIN, RIGHT 06/08/2010   Chronic pain of right knee 06/08/2010   MENISCUS TEAR, RIGHT 06/08/2010    Past Surgical History:  Procedure Laterality Date   COLONOSCOPY     DRUG INDUCED ENDOSCOPY Bilateral 08/07/2022   Procedure: DRUG INDUCED ENDOSCOPY;  Surgeon: Christia Reading, MD;  Location: [facility];  Service: ENT;  Laterality: Bilateral;   EYE SURGERY     Lasik   HERNIA REPAIR     IMPLANTATION OF HYPOGLOSSAL NERVE STIMULATOR Right 09/18/2022   Procedure: IMPLANTATION OF HYPOGLOSSAL NERVE STIMULATOR;  Surgeon: Christia Reading, MD;  Location: [facility];  Service: ENT;  Laterality: Right;   IMPLANTATION OF HYPOGLOSSAL NERVE STIMULATOR Right 03/19/2023   Procedure: REVISION IMPLANTATION OF HYPOGLOSSAL NERVE STIMULATOR REPLACEMENT;  Surgeon: Christia Reading, MD;  Location: [facility] OR;  Service: ENT;  Laterality: Right;   REPLACEMENT TOTAL KNEE Right    SHOULDER OPEN ROTATOR CUFF REPAIR Right 01/31/2022   TUBAL LIGATION  UPPER GI ENDOSCOPY      OB History     Gravida  1   Para  0   Term      Preterm      AB  1   Living  0      SAB      IAB      Ectopic      Multiple      Live Births               Home Medications    Prior to Admission medications   Medication Sig Start Date End Date Taking? Authorizing Provider  amLODipine (NORVASC) 5 MG tablet Take 5 mg by mouth in the morning. 07/26/13   [provider]  amoxicillin (AMOXIL) 500 MG capsule Take 1 capsule (500 mg total) by mouth 3 (three) times daily. 09/17/23   Serena Croissant, MD  amphetamine-dextroamphetamine (ADDERALL XR) 20 MG 24 hr  capsule Take 20 mg by mouth in the morning.    [provider]  diazepam (VALIUM) 5 MG tablet Take 1 tablet (5 mg total) by mouth once as needed for up to 1 dose for anxiety. Take 2 tabs for Breast MRI 12/03/23   Serena Croissant, MD  losartan (COZAAR) 100 MG tablet Take 1 tablet (100 mg total) by mouth daily. 12/04/15   Serena Croissant, MD  sertraline (ZOLOFT) 25 MG tablet Take 25 mg by mouth in the morning.    [provider]  valACYclovir (VALTREX) 1000 MG tablet Take 1,000 mg by mouth 2 (two) times daily as needed (fever blisters). 11/19/22   [provider]    Family History Family History  Problem Relation Age of Onset   Cancer Mother        THYROID   Breast cancer Mother 54   Diabetes Father    Cancer Brother        THYROID   Hypertension Brother    Breast cancer Maternal Grandmother    Thyroid cancer Maternal Aunt     Social History Social History   Tobacco Use   Smoking status: Never   Smokeless tobacco: Never  Vaping Use   Vaping status: Never Used  Substance Use Topics   Alcohol use: Yes    Alcohol/week: 4.0 standard drinks of alcohol    Types: 4 Standard drinks or equivalent per week    Comment: occasional wine, vodika   Drug use: No     Allergies   Patient has no known allergies.   Review of Systems Review of Systems  Constitutional:  Negative for fever.  Musculoskeletal:  Negative for joint swelling.  Skin:  Positive for wound. Negative for color change.  Neurological:  Negative for weakness.     Physical Exam Triage Vital Signs ED Triage Vitals  Encounter Vitals Group     BP 01/05/24 0838 (!) 138/93     Systolic BP Percentile --      Diastolic BP Percentile --      Pulse Rate 01/05/24 0838 78     Resp 01/05/24 0838 18     Temp 01/05/24 0838 97.9 F (36.6 C)     Temp Source 01/05/24 0838 Oral     SpO2 01/05/24 0838 97 %     Weight 01/05/24 0837 153 lb (69.4 kg)     Height 01/05/24 0837 5\' 7"  (1.702 m)     Head  Circumference --      Peak Flow --      Pain Score  01/05/24 0836 0     Pain Loc --      Pain Education --      Exclude from Growth Chart --    No data found.  Updated Vital Signs BP (!) 138/93 (BP Location: Right Arm)   Pulse 78   Temp 97.9 F (36.6 C) (Oral)   Resp 18   Ht 5\' 7"  (1.702 m)   Wt 153 lb (69.4 kg)   SpO2 97%   BMI 23.96 kg/m   Visual Acuity Right Eye Distance:   Left Eye Distance:   Bilateral Distance:    Right Eye Near:   Left Eye Near:    Bilateral Near:     Physical Exam Vitals and nursing note reviewed.  HENT:     Head: Normocephalic.  Musculoskeletal:     Comments: Left middle finger mild swelling over proximal phalanx, 0.5 cm healing wound volar surface over PIPJ, superficial wound dorsal surface , 2 healing wounds dorsal and plantar surface over palm none of the wounds have erythema, drainage or tenderness, middle finger with slight flexion able to extend fully, no deformity, capillary refill brisk  Skin:    General: Skin is warm and dry.     Comments: See musculoskeletal exam  Neurological:     Mental Status: She is alert.      UC Treatments / Results  Labs (all labs ordered are listed, but only abnormal results are displayed) Labs Reviewed - No data to display  EKG   Radiology No results found.  Procedures Procedures (including critical care time)  Medications Ordered in UC Medications - No data to display  Initial Impression / Assessment and Plan / UC Course  I have reviewed the triage vital signs and the nursing notes.  Pertinent labs & imaging results that were available during my care of the patient were reviewed by me and considered in my medical decision making (see chart for details).     63 year old female with swelling left middle finger had multiple injuries recently including a dog bite.  Discussed with patient she should follow-up with her hand specialist in 24 to 48 hours, recommend emergency room visit for  inability to straighten finger development of fever, redness or drainage Final Clinical Impressions(s) / UC Diagnoses   Final diagnoses:  None   Discharge Instructions   None    ED Prescriptions   None    PDMP not reviewed this encounter.   Meliton Rattan, [place] 01/05/24 [phone]

## 2024-01-05 NOTE — ED Triage Notes (Signed)
 Pt presents to urgent care with complaints of swollen left hand and left middle finger. Pt states she caught the finger on a ladder. Also was bite by her dog on this hand as well. Both occurred in the last 2-4 days. Pt currently denies pain. OTC Aleve taken with no noticeable improvement.

## 2024-01-05 NOTE — Discharge Instructions (Addendum)
 Follow-up with your hand specialist if the swelling fails to improve in the next 48 hours, follow-up sooner for increased swelling, inability to straighten finger development of redness, drainage or fever

## 2024-01-07 ENCOUNTER — Telehealth: Payer: Self-pay | Admitting: Cardiology

## 2024-01-07 NOTE — Telephone Encounter (Signed)
 We find it necessary to inform you that [facility] all providers practicing at this clinic will no longer be able to provide medical care to you. We believe that our patient/provider relationship has been compromised and thus would request that you seek care elsewhere. 12/26/23

## 2024-01-08 ENCOUNTER — Encounter: Payer: Self-pay | Admitting: Cardiology

## 2024-01-08 NOTE — Telephone Encounter (Signed)
 Pt is calling check the status of this message and would like a call back

## 2024-01-12 ENCOUNTER — Telehealth: Payer: Self-pay

## 2024-01-12 DIAGNOSIS — I77810 Thoracic aortic ectasia: Secondary | ICD-10-CM

## 2024-01-12 NOTE — Telephone Encounter (Signed)
 The patient has been notified of the result and verbalized understanding.  All questions (if any) were answered. Pt asked if she could be referred to Dr. Evelene Croon for her hernia. The pt was told I would run this by Dr. Mayford Knife before putting in the referral.

## 2024-01-12 NOTE — Telephone Encounter (Signed)
-----   Message from Armanda Magic sent at 01/10/2024  6:52 PM EDT ----- Chest CT showed mild to moderately dilated ascending aorta at 4.5 cm and moderate hiatal hernia  Please get patient established with CT surgery at [facility] for further follow-up

## 2024-01-13 NOTE — Telephone Encounter (Signed)
 Spoke with pt regarding Dr. Norris Cross suggestions. There was some confusion about the referrals. Pt wants to know if the referral to CT surgery at [facility] was for the moderately dilated ascending aorta (4.5 cm) or the moderate hiatal hernia as sometimes CT surgery fixes hiatal hernias. A referral to GI was also mentioned. Pt would like clarification before referrals are sent. I apologized for the confusion and let the pt know that I would confirm the orders with Dr. Mayford Knife and get back to her. Pt verbalized understanding. All questions, if any, were answered.

## 2024-01-13 NOTE — Telephone Encounter (Signed)
 Left voice message to call back 3/25

## 2024-01-14 NOTE — Addendum Note (Signed)
 Addended by: Erick Alley on: 01/14/2024 01:19 PM   Modules accepted: Orders

## 2024-01-14 NOTE — Telephone Encounter (Signed)
 Spoke with pt regarding Dr. Norris Cross suggestions. A referral to Cardiothoracic Surgery at [facility] was sent. Pt aware and agreeable. Pt also aware of Turner's suggestion to follow up with her PCP about the hiatal hernia. Pt verbalized understanding. All questions, if any, were answered.

## 2024-02-23 ENCOUNTER — Ambulatory Visit: Payer: BC Managed Care – PPO | Admitting: Cardiology

## 2024-02-25 ENCOUNTER — Encounter: Admitting: Surgery

## 2024-03-03 ENCOUNTER — Ambulatory Visit: Attending: Surgery | Admitting: Surgery

## 2024-03-03 ENCOUNTER — Encounter: Payer: Self-pay | Admitting: Surgery

## 2024-03-03 VITALS — BP 131/86 | HR 60 | Resp 20 | Ht 67.0 in | Wt 155.0 lb

## 2024-03-03 DIAGNOSIS — I7121 Aneurysm of the ascending aorta, without rupture: Secondary | ICD-10-CM

## 2024-03-03 NOTE — Progress Notes (Signed)
 Cardiothoracic Surgery Consultation  PCP is Robie Cho., MD Referring Provider is Robie Cho., MD  Chief Complaint  Patient presents with   Thoracic Aortic Aneurysm    HPI:  The patient is a 63 year old woman with history of hypertension, OSA previously treated with CPAP with intolerance who underwent placement of a hypoglossal nerve stimulator for INSPIRE treatment, and ascending aortic aneurysm diagnosed on a cardiac scoring CT in February 2025.  This study measured the ascending aorta at 4.8 cm.  She subsequently underwent a CTA of the chest on 12/30/2023 showing the aortic root to measure 4.1 cm at the sinus of Valsalva level.  The ascending aortic aneurysm measured 4.5 cm. She has a family history of coronary artery disease with her father having a myocardial infarction in his 87s.  There is no family history of aortic valve disease, aortic aneurysm, aortic dissection, or connective tissue disorder.  Past Medical History:  Diagnosis Date   ADHD (attention deficit hyperactivity disorder)    Agatston coronary artery calcium score less than 100    cor cal score 7.32   Anxiety    Ascending aortic aneurysm (HCC)    4.7cm on coronary Ca score CT 11/2023   Breast cancer screening, high risk patient 12/09/2012   Cancer (HCC)    skin CA removed from both legs, face   Hx of cervical cancer    Hypertension    Sleep apnea     Past Surgical History:  Procedure Laterality Date   COLONOSCOPY     DRUG INDUCED ENDOSCOPY Bilateral 08/07/2022   Procedure: DRUG INDUCED ENDOSCOPY;  Surgeon: Virgina Grills, MD;  Location: [facility];  Service: ENT;  Laterality: Bilateral;   EYE SURGERY     Lasik   HERNIA REPAIR     IMPLANTATION OF HYPOGLOSSAL NERVE STIMULATOR Right 09/18/2022   Procedure: IMPLANTATION OF HYPOGLOSSAL NERVE STIMULATOR;  Surgeon: Virgina Grills, MD;  Location: [facility];  Service: ENT;  Laterality: Right;   IMPLANTATION OF HYPOGLOSSAL  NERVE STIMULATOR Right 03/19/2023   Procedure: REVISION IMPLANTATION OF HYPOGLOSSAL NERVE STIMULATOR REPLACEMENT;  Surgeon: Virgina Grills, MD;  Location: [facility];  Service: ENT;  Laterality: Right;   REPLACEMENT TOTAL KNEE Right    SHOULDER OPEN ROTATOR CUFF REPAIR Right 01/31/2022   TUBAL LIGATION     UPPER GI ENDOSCOPY      Family History  Problem Relation Age of Onset   Cancer Mother        THYROID   Breast cancer Mother 60   Diabetes Father    Cancer Brother        THYROID   Hypertension Brother    Breast cancer Maternal Grandmother    Thyroid cancer Maternal Aunt     Social History Social History   Tobacco Use   Smoking status: Never   Smokeless tobacco: Never  Vaping Use   Vaping status: Never Used  Substance Use Topics   Alcohol use: Yes    Alcohol/week: 4.0 standard drinks of alcohol    Types: 4 Standard drinks or equivalent per week    Comment: occasional wine, vodika   Drug use: No    Current Outpatient Medications  Medication Sig Dispense Refill   amLODipine (NORVASC) 5 MG tablet Take 5 mg by mouth in the morning.     cloNIDine HCl (KAPVAY) 0.1 MG TB12 ER tablet Take 0.1 mg by mouth. 4 tablets at night     losartan  (COZAAR ) 100 MG tablet Take 1 tablet (  100 mg total) by mouth daily.     oxybutynin (DITROPAN-XL) 10 MG 24 hr tablet Take 10 mg by mouth at bedtime.     sertraline (ZOLOFT) 100 MG tablet Take 100 mg by mouth daily.     amoxicillin -clavulanate (AUGMENTIN ) 875-125 MG tablet Take 1 tablet by mouth every 12 (twelve) hours. (Patient not taking: Reported on 03/03/2024) 14 tablet 0   amphetamine-dextroamphetamine (ADDERALL XR) 20 MG 24 hr capsule Take 20 mg by mouth in the morning. (Patient not taking: Reported on 03/03/2024)     diazepam  (VALIUM ) 5 MG tablet Take 1 tablet (5 mg total) by mouth once as needed for up to 1 dose for anxiety. Take 2 tabs for Breast MRI (Patient not taking: Reported on 03/03/2024) 2 tablet 0   sertraline (ZOLOFT) 25 MG tablet Take 25  mg by mouth in the morning.     valACYclovir (VALTREX) 1000 MG tablet Take 1,000 mg by mouth 2 (two) times daily as needed (fever blisters). (Patient not taking: Reported on 03/03/2024)     No current facility-administered medications for this visit.    No Known Allergies  Review of Systems  Constitutional: Negative.   HENT:  Positive for hearing loss.   Eyes: Negative.   Respiratory:  Positive for apnea.   Cardiovascular: Negative.   Gastrointestinal: Negative.   Endocrine: Negative.   Genitourinary: Negative.   Musculoskeletal: Negative.   Allergic/Immunologic: Negative.   Neurological: Negative.   Hematological:  Bruises/bleeds easily.  Psychiatric/Behavioral:  The patient is nervous/anxious.     BP 131/86   Pulse 60   Resp 20   Ht 5\' 7"  (1.702 m)   Wt 155 lb (70.3 kg)   SpO2 96% Comment: RA  BMI 24.28 kg/m  Physical Exam Constitutional:      Appearance: Normal appearance. She is normal weight.  HENT:     Head: Normocephalic and atraumatic.  Eyes:     Extraocular Movements: Extraocular movements intact.     Pupils: Pupils are equal, round, and reactive to light.  Cardiovascular:     Rate and Rhythm: Normal rate and regular rhythm.     Heart sounds: Normal heart sounds. No murmur heard. Pulmonary:     Effort: Pulmonary effort is normal.     Breath sounds: Normal breath sounds.  Musculoskeletal:        General: No swelling.  Neurological:     General: No focal deficit present.     Mental Status: She is alert and oriented to person, place, and time.    Diagnostic Tests:  Narrative & Impression  CLINICAL DATA:  Thoracic aortic aneurysm   EXAM: CT ANGIOGRAPHY CHEST WITH CONTRAST   TECHNIQUE: Multidetector CT imaging of the chest was performed using the standard protocol during bolus administration of intravenous contrast. Multiplanar CT image reconstructions and MIPs were obtained to evaluate the vascular anatomy.   RADIATION DOSE REDUCTION: This exam  was performed according to the departmental dose-optimization program which includes automated exposure control, adjustment of the mA and/or kV according to patient size and/or use of iterative reconstruction technique.   CONTRAST:  75mL OMNIPAQUE  IOHEXOL  350 MG/ML SOLN   COMPARISON:  Cardiac CT scan 12/05/2023   FINDINGS: Cardiovascular: Conventional 3 vessel arch anatomy. No evidence of dissection. The aortic root is normal in caliber at 4.1 cm measured at the sinuses of Valsalva. No effacement of the sino-tubular junction. Fusiform aneurysmal dilation of the ascending thoracic aorta is present with a maximal diameter of 4.5 cm. No evidence of  dissection. The heart is normal in size. No pericardial effusion.   Mediastinum/Nodes: Unremarkable CT appearance of the thyroid gland. No suspicious mediastinal or hilar adenopathy. No soft tissue mediastinal mass. Moderate hiatal hernia.   Lungs/Pleura: Lungs are clear. No pleural effusion or pneumothorax.   Upper Abdomen: No acute abnormality.   Musculoskeletal: Generator pack within the superficial subcutaneous fat overlying the right pectoralis major muscle. Electrodes extend cephalad in the right neck and terminate off the image. No acute fracture or malalignment.   Review of the MIP images confirms the above findings.   IMPRESSION: 1. Fusiform aneurysmal dilation of the ascending thoracic aorta with a maximal diameter of 4.5 cm. Ascending thoracic aortic aneurysm. Recommend semi-annual imaging followup by CTA or MRA and referral to cardiothoracic surgery if not already obtained. This recommendation follows 2010 ACCF/AHA/AATS/ACR/ASA/SCA/SCAI/SIR/STS/SVM Guidelines for the Diagnosis and Management of Patients With Thoracic Aortic Disease. Circulation. 2010; 121: Z610-R604. 2. No acute cardiopulmonary process. 3. Moderate hiatal hernia.   Aortic aneurysm NOS (ICD10-I71.9)     Electronically Signed   By: Fernando Hoyer  M.D.   On: 01/10/2024 17:23    Impression:  This 63 year old woman has a 4.5 cm fusiform ascending aortic aneurysm.  She has not had an echocardiogram so we do not know if she has a trileaflet aortic valve or a bicuspid valve.  Her aneurysm is still below the usual surgical threshold of 5.5 cm in patients with a trileaflet aortic valve or 5.0 cm in patients with a bicuspid valve.  I reviewed the CTA images with her and answered all of her questions. I stressed the importance of continued good blood pressure control in preventing further enlargement and acute aortic dissection.  I advised her against doing any heavy lifting that may require a Valsalva maneuver and could suddenly raise her blood pressure to high levels.   Plan:  I will plan to see her back in 1 year with a CTA of the chest for aortic surveillance.  She said that she is set up to see a cardiologist at [facility] and I think she should have a baseline echocardiogram to evaluate the aortic valve at some point.  I spent 30 minutes performing this consultation and > 50% of this time was spent face to face counseling and coordinating the care of this patient's fusiform ascending aortic aneurysm.   Bartley Lightning, MD [facility] [phone]

## 2024-03-08 ENCOUNTER — Other Ambulatory Visit: Payer: Self-pay | Admitting: Medical Genetics

## 2024-03-12 ENCOUNTER — Ambulatory Visit: Admitting: Family Medicine

## 2024-03-29 ENCOUNTER — Ambulatory Visit (HOSPITAL_BASED_OUTPATIENT_CLINIC_OR_DEPARTMENT_OTHER)
Admission: RE | Admit: 2024-03-29 | Discharge: 2024-03-29 | Disposition: A | Discharge status: Home / Self Care | Source: Ambulatory Visit | Attending: Nurse Practitioner | Admitting: Nurse Practitioner

## 2024-03-29 DIAGNOSIS — Z780 Asymptomatic menopausal state: Secondary | ICD-10-CM | POA: Insufficient documentation

## 2024-04-13 ENCOUNTER — Other Ambulatory Visit (HOSPITAL_COMMUNITY)

## 2024-05-20 ENCOUNTER — Other Ambulatory Visit: Payer: Self-pay

## 2024-05-20 ENCOUNTER — Ambulatory Visit (INDEPENDENT_AMBULATORY_CARE_PROVIDER_SITE_OTHER): Admitting: Sports Medicine

## 2024-05-20 VITALS — BP 122/78 | Ht 66.5 in | Wt 151.0 lb

## 2024-05-20 DIAGNOSIS — M25552 Pain in left hip: Secondary | ICD-10-CM

## 2024-05-20 DIAGNOSIS — S76312A Strain of muscle, fascia and tendon of the posterior muscle group at thigh level, left thigh, initial encounter: Secondary | ICD-10-CM | POA: Diagnosis not present

## 2024-05-20 NOTE — Assessment & Plan Note (Signed)
 She was giving asking exercises A hamstring compression sleeve Heel lifts Meloxicam  Recheck in 6 weeks but if not improving we can try ESWT

## 2024-05-20 NOTE — Progress Notes (Signed)
 PCP: Zaida Elvie BRAVO., MD  Subjective:   HPI: Patient is a 63 y.o. female here for evaluation of pain in her left buttocks.  Says pain started a couple weeks ago and now bothers her while playing tennis.  Other activities that aggravate pain include bending down to pick anything off the floor as well as squatting.  Says she works out regularly and it has been getting worse.  Has history of piriformis pain roughly 1 decade ago and believes this is also piriformis pain.  However on evaluation patient has pain over the ischium at site of proximal hamstring attachment.  Past Medical History:  Diagnosis Date   ADHD (attention deficit hyperactivity disorder)    Agatston coronary artery calcium score less than 100    cor cal score 7.32   Anxiety    Ascending aortic aneurysm (HCC)    4.7cm on coronary Ca score CT 11/2023   Breast cancer screening, high risk patient 12/09/2012   Cancer (HCC)    skin CA removed from both legs, face   Hx of cervical cancer    Hypertension    Sleep apnea     Current Outpatient Medications on File Prior to Visit  Medication Sig Dispense Refill   amLODipine (NORVASC) 5 MG tablet Take 5 mg by mouth in the morning.     amoxicillin -clavulanate (AUGMENTIN ) 875-125 MG tablet Take 1 tablet by mouth every 12 (twelve) hours. (Patient not taking: Reported on 03/03/2024) 14 tablet 0   amphetamine-dextroamphetamine (ADDERALL XR) 20 MG 24 hr capsule Take 20 mg by mouth in the morning. (Patient not taking: Reported on 03/03/2024)     cloNIDine HCl (KAPVAY) 0.1 MG TB12 ER tablet Take 0.1 mg by mouth. 4 tablets at night     diazepam  (VALIUM ) 5 MG tablet Take 1 tablet (5 mg total) by mouth once as needed for up to 1 dose for anxiety. Take 2 tabs for Breast MRI (Patient not taking: Reported on 03/03/2024) 2 tablet 0   losartan  (COZAAR ) 100 MG tablet Take 1 tablet (100 mg total) by mouth daily.     oxybutynin (DITROPAN-XL) 10 MG 24 hr tablet Take 10 mg by mouth at bedtime.      sertraline (ZOLOFT) 100 MG tablet Take 100 mg by mouth daily.     sertraline (ZOLOFT) 25 MG tablet Take 25 mg by mouth in the morning.     valACYclovir (VALTREX) 1000 MG tablet Take 1,000 mg by mouth 2 (two) times daily as needed (fever blisters). (Patient not taking: Reported on 03/03/2024)     No current facility-administered medications on file prior to visit.    Past Surgical History:  Procedure Laterality Date   COLONOSCOPY     DRUG INDUCED ENDOSCOPY Bilateral 08/07/2022   Procedure: DRUG INDUCED ENDOSCOPY;  Surgeon: Carlie Clark, MD;  Location: [facility];  Service: ENT;  Laterality: Bilateral;   EYE SURGERY     Lasik   HERNIA REPAIR     IMPLANTATION OF HYPOGLOSSAL NERVE STIMULATOR Right 09/18/2022   Procedure: IMPLANTATION OF HYPOGLOSSAL NERVE STIMULATOR;  Surgeon: Carlie Clark, MD;  Location:  SURGERY CENTER;  Service: ENT;  Laterality: Right;   IMPLANTATION OF HYPOGLOSSAL NERVE STIMULATOR Right 03/19/2023   Procedure: REVISION IMPLANTATION OF HYPOGLOSSAL NERVE STIMULATOR REPLACEMENT;  Surgeon: Carlie Clark, MD;  Location: [facility] OR;  Service: ENT;  Laterality: Right;   REPLACEMENT TOTAL KNEE Right    SHOULDER OPEN ROTATOR CUFF REPAIR Right 01/31/2022   TUBAL LIGATION  UPPER GI ENDOSCOPY      No Known Allergies  BP 122/78   Ht 5' 6.5 (1.689 m)   Wt 151 lb (68.5 kg)   BMI 24.01 kg/m       No data to display              No data to display              Objective:  Physical Exam:  Gen: NAD, comfortable in exam room  Testing of left hamstrings reveal significant weakness of the medial hamstrings to resistance possibly because of pain with testing Biceps femoris testing is strong There is tenderness to palpation at the ischial tuberosity No swelling No atrophy observed  Ultrasound of the left hamstring  The ischial tuberosity is visualized There is some hypoechoic separation of the medial hamstring tendons as they attach to  the tuberosity There is some significant hypoechoic change between the Medial hamstring and biceps femoris muscle sheaths There is some slight irregularity at the ischial tuberosity  Impression: Findings consistent with a minor separation of the medial hamstring tendons to their proximal attachment at the ischial tuberosity  Ultrasound and interpretation by Dr. Lynwood and  Helene NOVAK. Fields, MD    Assessment & Plan:  1.  Hamstring strain with minor separation of tendon attachment at the ischial tuberosity of medial hamstrings  See problem list  I observed and examined the patient with the [facility] resident and agree with assessment and plan.  Note reviewed and modified by me. PATRICE Haddock, MD

## 2024-05-24 ENCOUNTER — Encounter: Payer: Self-pay | Admitting: Sports Medicine

## 2024-05-24 ENCOUNTER — Encounter: Payer: Self-pay | Admitting: Surgery

## 2024-06-16 ENCOUNTER — Other Ambulatory Visit: Payer: Self-pay

## 2024-06-16 DIAGNOSIS — M25552 Pain in left hip: Secondary | ICD-10-CM

## 2024-06-16 DIAGNOSIS — S76312A Strain of muscle, fascia and tendon of the posterior muscle group at thigh level, left thigh, initial encounter: Secondary | ICD-10-CM

## 2024-07-01 ENCOUNTER — Ambulatory Visit: Admitting: Sports Medicine

## 2024-08-16 ENCOUNTER — Other Ambulatory Visit: Payer: Self-pay

## 2024-08-16 ENCOUNTER — Ambulatory Visit
Admission: RE | Admit: 2024-08-16 | Discharge: 2024-08-16 | Disposition: A | Discharge status: Home / Self Care | Source: Ambulatory Visit | Attending: Emergency Medicine | Admitting: Emergency Medicine

## 2024-08-16 ENCOUNTER — Ambulatory Visit: Admitting: Radiology

## 2024-08-16 VITALS — BP 148/99 | HR 65 | Temp 97.6°F | Resp 16 | Ht 66.0 in | Wt 150.0 lb

## 2024-08-16 DIAGNOSIS — S0993XA Unspecified injury of face, initial encounter: Secondary | ICD-10-CM | POA: Diagnosis not present

## 2024-08-16 DIAGNOSIS — W19XXXA Unspecified fall, initial encounter: Secondary | ICD-10-CM

## 2024-08-16 DIAGNOSIS — S0511XA Contusion of eyeball and orbital tissues, right eye, initial encounter: Secondary | ICD-10-CM | POA: Diagnosis not present

## 2024-08-16 MED ORDER — CYCLOBENZAPRINE HCL 10 MG PO TABS: 10.0000 mg | ORAL_TABLET | Freq: Every evening | ORAL | 0 refills | Status: AC | PRN

## 2024-08-16 NOTE — ED Provider Notes (Signed)
 [facility]    CSN: [id number] Arrival date & time: 08/16/24  0844      History   Chief Complaint Chief Complaint  Patient presents with   Facial Pain   Fall    HPI Ashlee Robbins is a 63 y.o. female.  Presents for facial injury that occurred about 24 hours ago She tripped and fell to concrete floor Reports hitting right side of face and chin. She did not lose consciousness. No vomiting. Able to ambulate after fall. Having body aches all over, and 3/10 pain in the right face.   Took ibuprofen that helped. Last dose about 2 hours before coming to clinic  Applied silvadene cream to face  Not anticoagulated.  Denies headache, dizziness, vision changes, pain with eye movements  Last tdap was march 2025 per chart review   Past Medical History:  Diagnosis Date   ADHD (attention deficit hyperactivity disorder)    Agatston coronary artery calcium score less than 100    cor cal score 7.32   Anxiety    Ascending aortic aneurysm    4.7cm on coronary Ca score CT 11/2023   Breast cancer screening, high risk patient 12/09/2012   Cancer (HCC)    skin CA removed from both legs, face   Hx of cervical cancer    Hypertension    Sleep apnea     Patient Active Problem List   Diagnosis Date Noted   Hamstring strain, left, initial encounter 05/20/2024   Ascending aortic aneurysm    Agatston coronary artery calcium score less than 100    Obstructive sleep apnea 09/17/2023   At high risk for breast cancer 07/22/2022   Trigger middle finger of left hand 05/31/2019   Pain of right hand 06/01/2018   Torn ear lobe 08/01/2017   S/P TKR (total knee replacement) using cement, 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   Anxiety disorder 06/17/2016   Primary insomnia 06/17/2016   Vitamin D  deficiency 06/17/2016   Essential hypertension 12/19/2015   Vitamin B12 deficiency 12/19/2015   Right rotator cuff tendinitis 05/24/2014   Follicular cyst of skin and subcutaneous tissue 12/21/2013    Piriformis syndrome of right side 08/05/2013   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   Breast cancer screening, high risk patient 12/09/2012   HIP PAIN, RIGHT 06/08/2010   Chronic pain of right knee 06/08/2010   MENISCUS TEAR, RIGHT 06/08/2010    Past Surgical History:  Procedure Laterality Date   COLONOSCOPY     DRUG INDUCED ENDOSCOPY Bilateral 08/07/2022   Procedure: DRUG INDUCED ENDOSCOPY;  Surgeon: Carlie Clark, MD;  Location: [facility];  Service: ENT;  Laterality: Bilateral;   EYE SURGERY     Lasik   HERNIA REPAIR     IMPLANTATION OF HYPOGLOSSAL NERVE STIMULATOR Right 09/18/2022   Procedure: IMPLANTATION OF HYPOGLOSSAL NERVE STIMULATOR;  Surgeon: Carlie Clark, MD;  Location: [facility];  Service: ENT;  Laterality: Right;   IMPLANTATION OF HYPOGLOSSAL NERVE STIMULATOR Right 03/19/2023   Procedure: REVISION IMPLANTATION OF HYPOGLOSSAL NERVE STIMULATOR REPLACEMENT;  Surgeon: Carlie Clark, MD;  Location: [facility] OR;  Service: ENT;  Laterality: Right;   REPLACEMENT TOTAL KNEE Right    SHOULDER OPEN ROTATOR CUFF REPAIR Right 01/31/2022   TUBAL LIGATION     UPPER GI ENDOSCOPY      OB History     Gravida  1   Para  0   Term      Preterm      AB  1  Living  0      SAB      IAB      Ectopic      Multiple      Live Births               Home Medications    Prior to Admission medications   Medication Sig Start Date End Date Taking? Authorizing Provider  cyclobenzaprine (FLEXERIL) 10 MG tablet Take 1 tablet (10 mg total) by mouth at bedtime as needed for muscle spasms. 08/16/24  Yes Yechiel Erny, As[place], PA-C  amLODipine (NORVASC) 5 MG tablet Take 5 mg by mouth in the morning. 07/26/13   [provider]  cloNIDine HCl (KAPVAY) 0.1 MG TB12 ER tablet Take 0.1 mg by mouth. 4 tablets at night    [provider]  losartan  (COZAAR ) 100 MG tablet Take 1 tablet (100 mg total) by mouth daily. 12/04/15    Gudena, Vinay, MD  oxybutynin (DITROPAN-XL) 10 MG 24 hr tablet Take 10 mg by mouth at bedtime.    [provider]  sertraline (ZOLOFT) 100 MG tablet Take 100 mg by mouth daily.    [provider]  sertraline (ZOLOFT) 25 MG tablet Take 25 mg by mouth in the morning.    [provider]    Family History Family History  Problem Relation Age of Onset   Cancer Mother        THYROID   Breast cancer Mother 8   Diabetes Father    Cancer Brother        THYROID   Hypertension Brother    Breast cancer Maternal Grandmother    Thyroid cancer Maternal Aunt     Social History Social History   Tobacco Use   Smoking status: Never   Smokeless tobacco: Never  Vaping Use   Vaping status: Never Used  Substance Use Topics   Alcohol use: Yes    Alcohol/week: 4.0 standard drinks of alcohol    Types: 4 Standard drinks or equivalent per week    Comment: occasional wine, vodika   Drug use: No     Allergies   Patient has no known allergies.   Review of Systems Review of Systems   Physical Exam Triage Vital Signs ED Triage Vitals  Encounter Vitals Group     BP 08/16/24 0851 (!) 150/99     Girls Systolic BP Percentile --      Girls Diastolic BP Percentile --      Boys Systolic BP Percentile --      Boys Diastolic BP Percentile --      Pulse Rate 08/16/24 0851 65     Resp 08/16/24 0851 16     Temp 08/16/24 0851 97.6 F (36.4 C)     Temp Source 08/16/24 0851 Oral     SpO2 08/16/24 0851 97 %     Weight 08/16/24 0851 150 lb (68 kg)     Height 08/16/24 0851 5' 6 (1.676 m)     Head Circumference --      Peak Flow --      Pain Score 08/16/24 0905 3     Pain Loc --      Pain Education --      Exclude from Growth Chart --    No data found.  Updated Vital Signs BP (!) 148/99 (BP Location: Right Arm)   Pulse 65   Temp 97.6 F (36.4 C) (Oral)   Resp 16   Ht 5' 6 (1.676 m)  Wt 150 lb (68 kg)   SpO2 97%   BMI 24.21 kg/m   Visual Acuity Right Eye  Distance:   Left Eye Distance:   Bilateral Distance:    Right Eye Near:   Left Eye Near:    Bilateral Near:     Physical Exam Vitals and nursing note reviewed.  Constitutional:      General: She is not in acute distress.    Appearance: She is not ill-appearing or diaphoretic.  HENT:     Head: Contusion present. No laceration.     Jaw: There is normal jaw occlusion. No tenderness or pain on movement.     Comments: Jaw occlusion normal. No tenderness of TMJ or maxilla.     Right Ear: Tympanic membrane and ear canal normal.     Left Ear: Tympanic membrane and ear canal normal.     Nose: No nasal deformity, signs of injury, laceration or nasal tenderness.     Right Nostril: No epistaxis.     Left Nostril: No epistaxis.     Comments: Abrasions on distal nose, not bleeding. No epistaxis. Nasal bone non tender, no deformity     Mouth/Throat:     Mouth: No injury or lacerations.     Pharynx: Oropharynx is clear.     Comments: Small abrasion on the right lower lip, not bleeding. No trauma to tongue, all teeth intact without tenderness.  Eyes:     General: Vision grossly intact. Gaze aligned appropriately.        Right eye: No foreign body.     Extraocular Movements: Extraocular movements intact.     Right eye: Normal extraocular motion.     Conjunctiva/sclera: Conjunctivae normal.     Right eye: Right conjunctiva is not injected. No chemosis or hemorrhage.    Pupils: Pupils are equal, round, and reactive to light.     Comments: Swelling below right eye. Periorbital contusion. No open wound. Tender to palpation right orbital bone, without obvious deformity. EOM intact without pain. Conjunctiva clear, pupils equal and reactive. Vision grossly intact.   Neck:     Trachea: Trachea and phonation normal.     Comments: Full ROM of neck without pain. No bony tenderness C spine  Cardiovascular:     Rate and Rhythm: Normal rate and regular rhythm.     Pulses: Normal pulses.          Radial  pulses are 2+ on the right side and 2+ on the left side.     Heart sounds: Normal heart sounds.  Pulmonary:     Effort: Pulmonary effort is normal.     Breath sounds: Normal breath sounds.  Chest:     Chest wall: No tenderness.  Abdominal:     Palpations: Abdomen is soft.     Tenderness: There is no abdominal tenderness.  Musculoskeletal:     Cervical back: Normal range of motion. No rigidity. No spinous process tenderness or muscular tenderness. Normal range of motion.     Comments: Full ROM of extremities without pain. Strength and sensation intact, equal   Skin:    Capillary Refill: Capillary refill takes less than 2 seconds.     Findings: Abrasion and signs of injury present.     Comments: A few scattered abrasions on the left hand. Not bleeding.   Neurological:     Mental Status: She is alert and oriented to person, place, and time.     UC Treatments / Results  Labs (all labs  ordered are listed, but only abnormal results are displayed) Labs Reviewed - No data to display  EKG   Radiology DG Facial Bones Complete Result Date: 08/16/2024 EXAM: 1 or 2 VIEW(S) XRAY OF THE [facility] 08/16/2024 09:50:13 AM COMPARISON: Soft tissue radiograph 03/19/2023. CLINICAL HISTORY: Facial pain, bruising, and pain around forehead, nose, and right eye following a fall yesterday. Denies LOC. FINDINGS: BONES: No displaced facial fracture detected. Normal background bone mineralization. SINUSES: Bilateral paranasal sinuses and mastoids appear normally aerated. SOFT TISSUES: Right submandibular / sublingual space electrode redemonstrated. CERVICAL SPINE: Chronic mid cervical spine disc and endplate degeneration with chronic reversal of lordosis. Normal prevertebral soft tissue contour. IMPRESSION: 1. No displaced facial fracture detected. 2. If pain persists, non-contrast Face CT  evaluation is most sensitive. Electronically signed by: Helayne Hurst MD 08/16/2024 10:28 AM EDT RP Workstation: [id number]     Procedures Procedures (including critical care time)  Medications Ordered in UC Medications - No data to display  Initial Impression / Assessment and Plan / UC Course  I have reviewed the triage vital signs and the nursing notes.  Pertinent labs & imaging results that were available during my care of the patient were reviewed by me and considered in my medical decision making (see chart for details).  Overall stable vitals Neurologically intact  Incident occurred 24 hours ago, reassuring no new onset symptoms or acute worsening of pain. Pain is mild at this time. Declines tylenol  in clinic - reports will take at home.   Plain films facial bones without obvious fracture. Images independently reviewed by me, agree with radiology interpretation. Discussed with patient limitations of this type of imaging for her injuries.   Tetanus is up to date already  Pain control at home, try muscle relaxer, drowsy precautions Close monitoring of symptoms   Discussion with patient about symptoms that warrant immediate ED evaluation. Agrees to plan.   Final Clinical Impressions(s) / UC Diagnoses   Final diagnoses:  Facial injury, initial encounter  Periorbital contusion of right eye, initial encounter  Fall, initial encounter     Discharge Instructions      Staff will call you with the results of your xray  In the meantime I recommend pain control with ibuprofen and tylenol  You can take the muscle relaxer Flexeril at bed time. Please use caution as this likely will make you drowsy. Apply ice (wrapped in towel) to the face to reduce swelling. The bruising and swelling around your eye can worsen before it improves. The discoloration can last several weeks to months.   Please go to the emergency department if symptoms worsen. Especially with severe headache or facial pain, new dizziness or vision changes, pain when you move your eyes around, etc     ED Prescriptions     Medication  Sig Dispense Auth. Provider   cyclobenzaprine (FLEXERIL) 10 MG tablet Take 1 tablet (10 mg total) by mouth at bedtime as needed for muscle spasms. 20 tablet Brynda Heick, Asberry, PA-C      PDMP not reviewed this encounter.   Jeryl Asberry, [place] 08/16/24 1035

## 2024-08-16 NOTE — Discharge Instructions (Addendum)
 Staff will call you with the results of your xray  In the meantime I recommend pain control with ibuprofen and tylenol  You can take the muscle relaxer Flexeril at bed time. Please use caution as this likely will make you drowsy. Apply ice (wrapped in towel) to the face to reduce swelling. The bruising and swelling around your eye can worsen before it improves. The discoloration can last several weeks to months.   Please go to the emergency department if symptoms worsen. Especially with severe headache or facial pain, new dizziness or vision changes, pain when you move your eyes around, etc

## 2024-08-16 NOTE — ED Triage Notes (Signed)
 Pt presents with a chief complaint of facial pain following a fall yesterday at approximately 8 AM. States she was in a rush, tripped and fell directly on concrete. Bruising noted to right side of face and chin. Currently rates overall pain a 3/10. Aching all over. Denies LOC. Silver Silvadene cream applied to face with no noticeable improvement.

## 2024-08-17 ENCOUNTER — Ambulatory Visit (HOSPITAL_COMMUNITY): Payer: Self-pay

## 2024-08-24 ENCOUNTER — Ambulatory Visit: Admitting: Sports Medicine

## 2024-08-26 ENCOUNTER — Encounter: Payer: Self-pay | Admitting: Hematology and Oncology

## 2024-09-02 ENCOUNTER — Other Ambulatory Visit

## 2024-09-14 ENCOUNTER — Ambulatory Visit: Admitting: Sports Medicine

## 2024-09-14 VITALS — BP 124/76 | Ht 67.0 in | Wt 153.0 lb

## 2024-09-14 DIAGNOSIS — S76312A Strain of muscle, fascia and tendon of the posterior muscle group at thigh level, left thigh, initial encounter: Secondary | ICD-10-CM | POA: Diagnosis not present

## 2024-09-14 DIAGNOSIS — M25552 Pain in left hip: Secondary | ICD-10-CM | POA: Diagnosis not present

## 2024-09-14 MED ORDER — NITROGLYCERIN 0.2 MG/HR TD PT24: MEDICATED_PATCH | TRANSDERMAL | 1 refills | Status: AC

## 2024-09-14 MED ORDER — METHYLPREDNISOLONE ACETATE 40 MG/ML IJ SUSP
40.0000 mg | Freq: Once | INTRAMUSCULAR | Status: AC
  Administered 2024-09-14: 40 mg via INTRA_ARTICULAR

## 2024-09-14 NOTE — Progress Notes (Signed)
 Chief complaint follow-up of hamstring injury of left leg  Patient has been doing the home exercises we prescribed.  She is also going to physical therapy.  She feels that she has had a good response with about 50% relief of the pain that she was having before.  She is not having any sciatica.  She has however laid off of pickleball and tennis during the past month.  She would like to try an injection to see if this will get rid of some of the dull ache and help her be more comfortable playing.  Physical exam Pleasant female in no acute distress BP 124/76   Ht 5' 7 (1.702 m)   Wt 153 lb (69.4 kg)   BMI 23.96 kg/m   The patient has good hip range of motion Hamstring testing on the left leg is now up to normal strength She still has tenderness over the medial aspect of her ischial tuberosity will resolve the small tear Good overall strength of hip abductors  Procedure:  Injection of left ischial tuberosity Consent obtained and verified. Time-out conducted. Noted no overlying erythema, induration, or other signs of local infection. Skin prepped in a sterile fashion. Topical analgesic spray: Ethyl chloride. Completed without difficulty. Meds: 4 cc of lidocaine  1% and 1 cc of Solu-Medrol  Advised to call if fevers/chills, erythema, induration, drainage, or persistent bleeding.

## 2024-09-14 NOTE — Assessment & Plan Note (Signed)
 This is steadily improving with physical therapy and dry needling However because of some persistent pain that is limiting her tennis and pickleball we decided to try an injection today  This was done without difficulty She will also go on a nitroglycerin  protocol and see if that adds additional benefit Check with me in 6 to 8 weeks

## 2024-09-14 NOTE — Patient Instructions (Signed)

## 2024-09-21 ENCOUNTER — Inpatient Hospital Stay: Payer: BC Managed Care – PPO | Attending: Hematology and Oncology | Admitting: Hematology and Oncology

## 2024-09-21 VITALS — BP 135/91 | HR 82 | Temp 97.6°F | Resp 18 | Ht 67.0 in | Wt 156.7 lb

## 2024-09-21 DIAGNOSIS — Z1239 Encounter for other screening for malignant neoplasm of breast: Secondary | ICD-10-CM

## 2024-09-21 DIAGNOSIS — G4730 Sleep apnea, unspecified: Secondary | ICD-10-CM | POA: Diagnosis not present

## 2024-09-21 DIAGNOSIS — Z9189 Other specified personal risk factors, not elsewhere classified: Secondary | ICD-10-CM | POA: Diagnosis not present

## 2024-09-21 DIAGNOSIS — I719 Aortic aneurysm of unspecified site, without rupture: Secondary | ICD-10-CM | POA: Insufficient documentation

## 2024-09-21 NOTE — Progress Notes (Signed)
 Patient Care Team: Zaida Elvie BRAVO., MD as PCP - General (Family Medicine)  DIAGNOSIS:  Encounter Diagnosis  Name Primary?   Breast cancer screening, high risk patient Yes   CHIEF COMPLIANT: Surveillance for breast cancer  HISTORY OF PRESENT ILLNESS:  History of Present Illness Ashlee Robbins is a 63 year old female who presents for follow-up after an incidental finding of an aortic aneurysm on a coronary CT scan.  She had coronary CT because of a strong family history of heart disease, which showed an incidental 4.5 cm aortic aneurysm. She also has sleep apnea treated with an Inspire device. She alternates breast mammograms and MRIs for screening, but MRI access is complicated by the device.     ALLERGIES:  has no known allergies.  MEDICATIONS:  Current Outpatient Medications  Medication Sig Dispense Refill   cloNIDine HCl (KAPVAY) 0.1 MG TB12 ER tablet Take 0.1 mg by mouth. 4 tablets at night     cyclobenzaprine  (FLEXERIL ) 10 MG tablet Take 1 tablet (10 mg total) by mouth at bedtime as needed for muscle spasms. 20 tablet 0   losartan  (COZAAR ) 100 MG tablet Take 1 tablet (100 mg total) by mouth daily.     nitroGLYCERIN  (NITRODUR - DOSED IN MG/24 HR) 0.2 mg/hr patch Use 1/4 patch daily to the affected area. 30 patch 1   sertraline (ZOLOFT) 100 MG tablet Take 100 mg by mouth daily.     sertraline (ZOLOFT) 25 MG tablet Take 25 mg by mouth in the morning.     amLODipine (NORVASC) 5 MG tablet Take 5 mg by mouth in the morning. (Patient not taking: Reported on 09/21/2024)     No current facility-administered medications for this visit.    PHYSICAL EXAMINATION: ECOG PERFORMANCE STATUS: 1 - Symptomatic but completely ambulatory  Vitals:   09/21/24 1519  BP: (!) 135/91  Pulse: 82  Resp: 18  Temp: 97.6 F (36.4 C)  SpO2: 97%   Filed Weights   09/21/24 1519  Weight: 156 lb 11.2 oz (71.1 kg)    Physical Exam Breast exam: No palpable lumps or nodules exam     (exam  performed in the presence of a chaperone)  LABORATORY DATA:  I have reviewed the data as listed    Latest Ref Rng & Units 03/19/2023    9:53 AM 12/09/2012   12:30 PM 11/26/2010    1:39 PM  CMP  Glucose 70 - 99 mg/dL 79  74  85   BUN 8 - 23 mg/dL 22  78.0  16   Creatinine 0.44 - 1.00 mg/dL 9.20  1.0  9.08   Sodium 135 - 145 mmol/L 138  144  140   Potassium 3.5 - 5.1 mmol/L 4.1  4.0  4.2   Chloride 98 - 111 mmol/L 106  107  101   CO2 22 - 32 mmol/L 23  28  26    Calcium 8.9 - 10.3 mg/dL 8.6  9.3  89.8   Total Protein 6.4 - 8.3 g/dL  7.2  7.2   Total Bilirubin 0.20 - 1.20 mg/dL  9.40  0.6   Alkaline Phos 40 - 150 U/L  54  44   AST 5 - 34 U/L  21  26   ALT 0 - 55 U/L  19  18     Lab Results  Component Value Date   WBC 4.1 03/19/2023   HGB 13.1 03/19/2023   HCT 38.8 03/19/2023   MCV 99.2 03/19/2023   PLT  314 03/19/2023   NEUTROABS 3.5 12/09/2012    ASSESSMENT & PLAN:  Breast cancer screening, high risk patient High-risk for developing breast cancer due to her family history. Her breast cancer risk estimated at 20-25%. Patient has had BRCA1 and 2 testing performed which was negative for both. Could not tolerate Evista. Currently on surveillance.   Breast Cancer Surveillance: 1. Mammogram 06/06/2023 no abnormalities.  Breast Density Category C.  2.  Breast MRI 11/22/2019: Benign, breast density category B   We discussed the role of contrast-enhanced mammograms and recommended that instead of doing MRI mammogram alternating every 6 months we could do once a year contrast-enhanced mammogram for her high risk surveillance.  The advantage of this approach is the lower level of false positive rates with a contrast-enhanced mammogram compared to a breast MRI.   Return to clinic in 1 year for follow-up      No orders of the defined types were placed in this encounter.  The patient has a good understanding of the overall plan. she agrees with it. she will call with any problems that may  develop before the next visit here.  I personally spent a total of 30 minutes in the care of the patient today including preparing to see the patient, getting/reviewing separately obtained history, performing a medically appropriate exam/evaluation, counseling and educating, placing orders, referring and communicating with other health care professionals, documenting clinical information in the EHR, independently interpreting results, communicating results, and coordinating care.   Viinay K Jaimie Redditt, MD 09/21/24

## 2024-09-21 NOTE — Assessment & Plan Note (Signed)
 High-risk for developing breast cancer due to her family history. Her breast cancer risk estimated at 20-25%. Patient has had BRCA1 and 2 testing performed which was negative for both. Could not tolerate Evista. Currently on surveillance.   Breast Cancer Surveillance: 1. Mammogram 06/06/2023 no abnormalities.  Breast Density Category C.  2.  Breast MRI 11/22/2019: Benign, breast density category B   We discussed the role of contrast-enhanced mammograms and recommended that instead of doing MRI mammogram alternating every 6 months we could do once a year contrast-enhanced mammogram for her high risk surveillance.  The advantage of this approach is the lower level of false positive rates with a contrast-enhanced mammogram compared to a breast MRI.   Telephone visit in 1 year

## 2024-10-18 ENCOUNTER — Telehealth: Payer: Self-pay

## 2024-10-18 NOTE — Telephone Encounter (Signed)
 Email received from Camie Daring at [facility] regarding pt's need for CEM MM and when it is needed. Order updated per MD to reflect need for 12/09/2024. Attempted to call pt to educate her  that CEM MM takes the place of her need for MRI per MD note. LVM for call back.

## 2024-10-19 ENCOUNTER — Telehealth: Payer: Self-pay

## 2024-10-19 ENCOUNTER — Encounter: Payer: Self-pay | Admitting: Hematology and Oncology

## 2024-10-19 NOTE — Telephone Encounter (Signed)
 Called pt back and explained role of CEM MM to her. She is agreeable and understands someone from [facility] will call her to schedule.

## 2024-10-26 ENCOUNTER — Ambulatory Visit: Admitting: Sports Medicine

## 2024-10-29 ENCOUNTER — Other Ambulatory Visit: Payer: Self-pay

## 2024-10-29 ENCOUNTER — Ambulatory Visit
Admission: RE | Admit: 2024-10-29 | Discharge: 2024-10-29 | Disposition: A | Discharge status: Home / Self Care | Attending: Physician Assistant

## 2024-10-29 VITALS — BP 129/88 | HR 65 | Temp 98.0°F | Resp 18

## 2024-10-29 DIAGNOSIS — J9801 Acute bronchospasm: Secondary | ICD-10-CM | POA: Diagnosis not present

## 2024-10-29 DIAGNOSIS — R0981 Nasal congestion: Secondary | ICD-10-CM | POA: Diagnosis not present

## 2024-10-29 MED ORDER — BENZONATATE 100 MG PO CAPS: 100.0000 mg | ORAL_CAPSULE | Freq: Three times a day (TID) | ORAL | 0 refills | Status: AC

## 2024-10-29 MED ORDER — FLUTICASONE PROPIONATE 50 MCG/ACT NA SUSP: 1.0000 | Freq: Every day | NASAL | 0 refills | Status: AC

## 2024-10-29 MED ORDER — ALBUTEROL SULFATE HFA 108 (90 BASE) MCG/ACT IN AERS: 1.0000 | INHALATION_SPRAY | Freq: Four times a day (QID) | RESPIRATORY_TRACT | 0 refills | Status: AC | PRN

## 2024-10-29 NOTE — ED Provider Notes (Signed)
 " [facility]    CSN: [id number] Arrival date & time: 10/29/24  1501      History   Chief Complaint Chief Complaint  Patient presents with   Cough    HAVING EYE SURGERY TUESDAY - NEED SOMETHING STRONG TO KNOCK THIS OUT SO SURGERY DOES NOT GET CANCELLED AGAIN.  HAD TO CANCEL LAST TUESDAY DUE TO FEELING LIKE 18 WHEELER HIT ME. NO APPETITE, SLEEPY, RUNNY NOSE, HEADACHE, COUGHING - YELLOW MUCUS - Entered by patient    HPI Ashlee Robbins is a 64 y.o. female.   HPI  Pt is here today with concerns of a cough. She states she has had sore throat, runny nose that has been resolving. She currently has some lingering postnasal drainage and coughing spells. She states her throat is a bit scratchy still. She states this started sometime last week and led to her having to reschedule cataract surgery for this coming Tuesday so she would like to be better in time for this.  Interventions: Tylenol    Past Medical History:  Diagnosis Date   ADHD (attention deficit hyperactivity disorder)    Agatston coronary artery calcium score less than 100    cor cal score 7.32   Anxiety    Ascending aortic aneurysm    4.7cm on coronary Ca score CT 11/2023   Breast cancer screening, high risk patient 12/09/2012   Cancer (HCC)    skin CA removed from both legs, face   Hx of cervical cancer    Hypertension    Sleep apnea     Patient Active Problem List   Diagnosis Date Noted   Hamstring strain, left, initial encounter 05/20/2024   Ascending aortic aneurysm    Agatston coronary artery calcium score less than 100    Obstructive sleep apnea 09/17/2023   At high risk for breast cancer 07/22/2022   Trigger middle finger of left hand 05/31/2019   Pain of right hand 06/01/2018   Torn ear lobe 08/01/2017   S/P TKR (total knee replacement) using cement, 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   Anxiety disorder 06/17/2016   Primary insomnia 06/17/2016   Vitamin D  deficiency 06/17/2016   Essential hypertension 12/19/2015    Vitamin B12 deficiency 12/19/2015   Right rotator cuff tendinitis 05/24/2014   Follicular cyst of skin and subcutaneous tissue 12/21/2013   Piriformis syndrome of right side 08/05/2013   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   Breast cancer screening, high risk patient 12/09/2012   HIP PAIN, RIGHT 06/08/2010   Chronic pain of right knee 06/08/2010   MENISCUS TEAR, RIGHT 06/08/2010    Past Surgical History:  Procedure Laterality Date   COLONOSCOPY     DRUG INDUCED ENDOSCOPY Bilateral 08/07/2022   Procedure: DRUG INDUCED ENDOSCOPY;  Surgeon: Carlie Clark, MD;  Location: [facility];  Service: ENT;  Laterality: Bilateral;   EYE SURGERY     Lasik   HERNIA REPAIR     IMPLANTATION OF HYPOGLOSSAL NERVE STIMULATOR Right 09/18/2022   Procedure: IMPLANTATION OF HYPOGLOSSAL NERVE STIMULATOR;  Surgeon: Carlie Clark, MD;  Location: [facility];  Service: ENT;  Laterality: Right;   IMPLANTATION OF HYPOGLOSSAL NERVE STIMULATOR Right 03/19/2023   Procedure: REVISION IMPLANTATION OF HYPOGLOSSAL NERVE STIMULATOR REPLACEMENT;  Surgeon: Carlie Clark, MD;  Location: [facility] OR;  Service: ENT;  Laterality: Right;   REPLACEMENT TOTAL KNEE Right    SHOULDER OPEN ROTATOR CUFF REPAIR Right 01/31/2022   TUBAL LIGATION     UPPER GI ENDOSCOPY  OB History     Gravida  1   Para  0   Term      Preterm      AB  1   Living  0      SAB      IAB      Ectopic      Multiple      Live Births               Home Medications    Prior to Admission medications  Medication Sig Start Date End Date Taking? Authorizing Provider  albuterol  (VENTOLIN  HFA) 108 (90 Base) MCG/ACT inhaler Inhale 1-2 puffs into the lungs every 6 (six) hours as needed for wheezing or shortness of breath. 10/29/24  Yes Xuan Mateus E, PA-C  benzonatate  (TESSALON ) 100 MG capsule Take 1 capsule (100 mg total) by mouth every 8 (eight) hours. 10/29/24  Yes Rage Beever E, PA-C   fluticasone  (FLONASE ) 50 MCG/ACT nasal spray Place 1 spray into both nostrils daily. 10/29/24  Yes Suman Trivedi E, PA-C  amLODipine (NORVASC) 5 MG tablet Take 5 mg by mouth in the morning. Patient not taking: Reported on 09/21/2024 07/26/13   [provider]  cloNIDine HCl (KAPVAY) 0.1 MG TB12 ER tablet Take 0.1 mg by mouth. 4 tablets at night    [provider]  cyclobenzaprine  (FLEXERIL ) 10 MG tablet Take 1 tablet (10 mg total) by mouth at bedtime as needed for muscle spasms. 08/16/24   Rising, Asberry, PA-C  losartan  (COZAAR ) 100 MG tablet Take 1 tablet (100 mg total) by mouth daily. 12/04/15   Gudena, Vinay, MD  nitroGLYCERIN  (NITRODUR - DOSED IN MG/24 HR) 0.2 mg/hr patch Use 1/4 patch daily to the affected area. 09/14/24   Harvey Seltzer, MD  sertraline (ZOLOFT) 100 MG tablet Take 100 mg by mouth daily.    [provider]  sertraline (ZOLOFT) 25 MG tablet Take 25 mg by mouth in the morning.    [provider]    Family History Family History  Problem Relation Age of Onset   Cancer Mother        THYROID   Breast cancer Mother 8   Diabetes Father    Cancer Brother        THYROID   Hypertension Brother    Breast cancer Maternal Grandmother    Thyroid cancer Maternal Aunt     Social History Social History[1]   Allergies   Patient has no known allergies.   Review of Systems Review of Systems  Constitutional:  Positive for appetite change. Negative for chills and fever.  HENT:  Positive for congestion, postnasal drip, rhinorrhea and sore throat.   Respiratory:  Positive for cough.      Physical Exam Triage Vital Signs ED Triage Vitals  Encounter Vitals Group     BP 10/29/24 1516 129/88     Girls Systolic BP Percentile --      Girls Diastolic BP Percentile --      Boys Systolic BP Percentile --      Boys Diastolic BP Percentile --      Pulse Rate 10/29/24 1516 65     Resp 10/29/24 1516 18     Temp 10/29/24 1516 98 F (36.7 C)     Temp  Source 10/29/24 1516 Oral     SpO2 10/29/24 1516 94 %     Weight --      Height --      Head Circumference --  Peak Flow --      Pain Score 10/29/24 1514 0     Pain Loc --      Pain Education --      Exclude from Growth Chart --    No data found.  Updated Vital Signs BP 129/88 (BP Location: Left Arm)   Pulse 65   Temp 98 F (36.7 C) (Oral)   Resp 18   SpO2 95%   Visual Acuity Right Eye Distance:   Left Eye Distance:   Bilateral Distance:    Right Eye Near:   Left Eye Near:    Bilateral Near:     Physical Exam Vitals reviewed.  Constitutional:      General: She is awake.     Appearance: Normal appearance. She is well-developed and well-groomed.  HENT:     Head: Normocephalic and atraumatic.     Right Ear: Hearing, tympanic membrane and ear canal normal.     Left Ear: Hearing, tympanic membrane and ear canal normal.     Mouth/Throat:     Lips: Pink.     Mouth: Mucous membranes are moist.     Pharynx: Oropharynx is clear. Uvula midline. No pharyngeal swelling, oropharyngeal exudate, posterior oropharyngeal erythema, uvula swelling or postnasal drip.  Cardiovascular:     Rate and Rhythm: Normal rate and regular rhythm.     Pulses: Normal pulses.          Radial pulses are 2+ on the right side and 2+ on the left side.     Heart sounds: Normal heart sounds. No murmur heard.    No friction rub. No gallop.  Pulmonary:     Effort: Pulmonary effort is normal.     Breath sounds: Normal breath sounds. No decreased air movement. No decreased breath sounds, wheezing, rhonchi or rales.  Musculoskeletal:     Cervical back: Normal range of motion and neck supple.  Lymphadenopathy:     Head:     Right side of head: No submental, submandibular or preauricular adenopathy.     Left side of head: No submental, submandibular or preauricular adenopathy.     Cervical:     Right cervical: No superficial cervical adenopathy.    Left cervical: No superficial cervical adenopathy.      Upper Body:     Right upper body: No supraclavicular adenopathy.     Left upper body: No supraclavicular adenopathy.  Skin:    General: Skin is warm and dry.  Neurological:     General: No focal deficit present.     Mental Status: She is alert and oriented to person, place, and time.  Psychiatric:        Mood and Affect: Mood normal.        Behavior: Behavior is cooperative.        Thought Content: Thought content normal.        Judgment: Judgment normal.      UC Treatments / Results  Labs (all labs ordered are listed, but only abnormal results are displayed) Labs Reviewed - No data to display  EKG   Radiology No results found.  Procedures Procedures (including critical care time)  Medications Ordered in UC Medications - No data to display  Initial Impression / Assessment and Plan / UC Course  I have reviewed the triage vital signs and the nursing notes.  Pertinent labs & imaging results that were available during my care of the patient were reviewed by me and considered in my medical decision  making (see chart for details).      Final Clinical Impressions(s) / UC Diagnoses   Final diagnoses:  Cough due to bronchospasm  Nasal congestion   Patient is here today with concerns of a persistent cough that been ongoing since sometime last week.  She states that her cough is persistent but she denies productive cough.  She reports having coughing spells along with some postnasal drainage.  Physical exam vitals are largely reassuring.  At this time I suspect cough due to bronchospasm.  Will start patient on rescue inhaler, Tessalon  Perles and Flonase  nasal spray to assist with symptom management.  Recommend continued use of OTC medications for further symptomatic relief.  ED and return precautions reviewed and provided in AVS.  Follow-up as needed    Discharge Instructions      You are seen today for concerns of coughing spells, nasal congestion and runny nose.  At  this time I suspect that you are likely recovering from a recent viral illness.  To help with managing your symptoms I have sent in a prescription for an albuterol  rescue inhaler, Tessalon  Perles and Flonase . You can use the albuterol  rescue inhaler up to every 6 hours as needed for shortness of breath and coughing.  This should help reduce the inflammation in your lungs that is causing you to have the coughing spells. You can use the Tessalon  Perles as needed every 8-12 hours.  This helps reduce the coughing reflex and should help with your cough. Please use the Flonase  twice per day.  You can spray 1 spray into each nostril twice per day.  The goal of treatment at this time is to reduce your symptoms and discomfort   I recommend using Robitussin and Mucinex (regular formulations, nothing with decongestants or DM)  You can also use Tylenol  for body aches and fever reduction I also recommend adding an antihistamine to your daily regimen This includes medications like Claritin, Allegra, Zyrtec- the generics of these work very well and are usually less expensive I recommend using Flonase  nasal spray - 2 puffs twice per day to help with your nasal congestion The antihistamines and Flonase  can take a few weeks to provide significant relief from allergy symptoms but should start to provide some benefit soon. You can use a humidifier at night to help with preventing nasal dryness and irritation   If your symptoms are not improving or seem to be getting worse over the next 5 to 7 days you can always return here to urgent care or you can follow-up with your primary care provider for ongoing management Go to the ER if you begin to have more serious symptoms such as shortness of breath, trouble breathing, loss of consciousness, swelling around the eyes, high fever, severe lasting headaches, vision changes or neck pain/stiffness.       ED Prescriptions     Medication Sig Dispense Auth. Provider    albuterol  (VENTOLIN  HFA) 108 (90 Base) MCG/ACT inhaler Inhale 1-2 puffs into the lungs every 6 (six) hours as needed for wheezing or shortness of breath. 8 g Aspin Palomarez E, PA-C   benzonatate  (TESSALON ) 100 MG capsule Take 1 capsule (100 mg total) by mouth every 8 (eight) hours. 21 capsule Laasya Peyton E, PA-C   fluticasone  (FLONASE ) 50 MCG/ACT nasal spray Place 1 spray into both nostrils daily. 11.1 mL Alexus Michael E, PA-C      PDMP not reviewed this encounter.     [1]  Social History Tobacco Use  Smoking status: Never   Smokeless tobacco: Never  Vaping Use   Vaping status: Never Used  Substance Use Topics   Alcohol use: Yes    Alcohol/week: 4.0 standard drinks of alcohol    Types: 4 Standard drinks or equivalent per week    Comment: occasional wine, vodika   Drug use: No     Zamora Colton, Rocky BRAVO, PA-C 10/29/24 2019  "

## 2024-10-29 NOTE — ED Triage Notes (Signed)
 Pt presents to urgent care with a chief complaint of productive cough x 1 week. States she is having cataract surgery on Tuesday 1/13. Worries her as once she starts coughing, she cannot stop for a few minutes. No pain. Has only taken OTC Tylenol  for sxs reported with no improvement/relief. Does endorse sick contacts.

## 2024-10-29 NOTE — Discharge Instructions (Addendum)
 You are seen today for concerns of coughing spells, nasal congestion and runny nose.  At this time I suspect that you are likely recovering from a recent viral illness.  To help with managing your symptoms I have sent in a prescription for an albuterol  rescue inhaler, Tessalon  Perles and Flonase . You can use the albuterol  rescue inhaler up to every 6 hours as needed for shortness of breath and coughing.  This should help reduce the inflammation in your lungs that is causing you to have the coughing spells. You can use the Tessalon  Perles as needed every 8-12 hours.  This helps reduce the coughing reflex and should help with your cough. Please use the Flonase  twice per day.  You can spray 1 spray into each nostril twice per day.  The goal of treatment at this time is to reduce your symptoms and discomfort   I recommend using Robitussin and Mucinex (regular formulations, nothing with decongestants or DM)  You can also use Tylenol  for body aches and fever reduction I also recommend adding an antihistamine to your daily regimen This includes medications like Claritin, Allegra, Zyrtec- the generics of these work very well and are usually less expensive I recommend using Flonase  nasal spray - 2 puffs twice per day to help with your nasal congestion The antihistamines and Flonase  can take a few weeks to provide significant relief from allergy symptoms but should start to provide some benefit soon. You can use a humidifier at night to help with preventing nasal dryness and irritation   If your symptoms are not improving or seem to be getting worse over the next 5 to 7 days you can always return here to urgent care or you can follow-up with your primary care provider for ongoing management Go to the ER if you begin to have more serious symptoms such as shortness of breath, trouble breathing, loss of consciousness, swelling around the eyes, high fever, severe lasting headaches, vision changes or neck  pain/stiffness.

## 2025-09-21 ENCOUNTER — Inpatient Hospital Stay: Admitting: Hematology and Oncology
